# Patient Record
Sex: Male | Born: 1980 | Race: White | Hispanic: No | Marital: Married | Smoking: Never smoker
Health system: Southern US, Community
[De-identification: ages and names within clinical notes are randomized; demographics above are authoritative.]

## PROBLEM LIST (undated history)

## (undated) DIAGNOSIS — B2790 Infectious mononucleosis, unspecified without complication: Secondary | ICD-10-CM

## (undated) DIAGNOSIS — J020 Streptococcal pharyngitis: Secondary | ICD-10-CM

## (undated) HISTORY — PX: WISDOM TOOTH EXTRACTION: SHX21

---

## 2004-08-08 ENCOUNTER — Ambulatory Visit: Payer: Self-pay | Admitting: Family Medicine

## 2005-03-15 ENCOUNTER — Emergency Department: Payer: Self-pay | Admitting: Emergency Medicine

## 2017-12-18 DIAGNOSIS — E782 Mixed hyperlipidemia: Secondary | ICD-10-CM | POA: Insufficient documentation

## 2020-01-30 DIAGNOSIS — U071 COVID-19: Secondary | ICD-10-CM

## 2020-01-30 HISTORY — DX: COVID-19: U07.1

## 2021-07-04 ENCOUNTER — Other Ambulatory Visit: Payer: Self-pay

## 2021-07-04 ENCOUNTER — Emergency Department (HOSPITAL_BASED_OUTPATIENT_CLINIC_OR_DEPARTMENT_OTHER): Payer: 59 | Admitting: Radiology

## 2021-07-04 ENCOUNTER — Emergency Department (HOSPITAL_BASED_OUTPATIENT_CLINIC_OR_DEPARTMENT_OTHER): Payer: 59

## 2021-07-04 ENCOUNTER — Encounter (HOSPITAL_BASED_OUTPATIENT_CLINIC_OR_DEPARTMENT_OTHER): Payer: Self-pay | Admitting: Obstetrics and Gynecology

## 2021-07-04 ENCOUNTER — Emergency Department (HOSPITAL_BASED_OUTPATIENT_CLINIC_OR_DEPARTMENT_OTHER)
Admission: EM | Admit: 2021-07-04 | Discharge: 2021-07-04 | Disposition: A | Discharge status: Home / Self Care | Payer: 59 | Attending: Emergency Medicine | Admitting: Emergency Medicine

## 2021-07-04 DIAGNOSIS — R5383 Other fatigue: Secondary | ICD-10-CM | POA: Diagnosis not present

## 2021-07-04 DIAGNOSIS — R000 Tachycardia, unspecified: Secondary | ICD-10-CM | POA: Insufficient documentation

## 2021-07-04 DIAGNOSIS — R945 Abnormal results of liver function studies: Secondary | ICD-10-CM | POA: Insufficient documentation

## 2021-07-04 DIAGNOSIS — R748 Abnormal levels of other serum enzymes: Secondary | ICD-10-CM

## 2021-07-04 DIAGNOSIS — R110 Nausea: Secondary | ICD-10-CM | POA: Diagnosis not present

## 2021-07-04 DIAGNOSIS — E860 Dehydration: Secondary | ICD-10-CM | POA: Insufficient documentation

## 2021-07-04 DIAGNOSIS — R519 Headache, unspecified: Secondary | ICD-10-CM | POA: Insufficient documentation

## 2021-07-04 DIAGNOSIS — R7401 Elevation of levels of liver transaminase levels: Secondary | ICD-10-CM | POA: Insufficient documentation

## 2021-07-04 HISTORY — DX: Streptococcal pharyngitis: J02.0

## 2021-07-04 HISTORY — DX: Infectious mononucleosis, unspecified without complication: B27.90

## 2021-07-04 LAB — LACTIC ACID, PLASMA: Lactic Acid, Venous: 0.8 mmol/L (ref 0.5–1.9)

## 2021-07-04 LAB — CBC WITH DIFFERENTIAL/PLATELET
Abs Immature Granulocytes: 0.02 10*3/uL (ref 0.00–0.07)
Basophils Absolute: 0.1 10*3/uL (ref 0.0–0.1)
Basophils Relative: 1 %
Eosinophils Absolute: 0 10*3/uL (ref 0.0–0.5)
Eosinophils Relative: 0 %
HCT: 42.8 % (ref 39.0–52.0)
Hemoglobin: 14.9 g/dL (ref 13.0–17.0)
Immature Granulocytes: 0 %
Lymphocytes Relative: 31 %
Lymphs Abs: 2.3 10*3/uL (ref 0.7–4.0)
MCH: 29.7 pg (ref 26.0–34.0)
MCHC: 34.8 g/dL (ref 30.0–36.0)
MCV: 85.4 fL (ref 80.0–100.0)
Monocytes Absolute: 0.4 10*3/uL (ref 0.1–1.0)
Monocytes Relative: 6 %
Neutro Abs: 4.7 10*3/uL (ref 1.7–7.7)
Neutrophils Relative %: 62 %
Platelets: 339 10*3/uL (ref 150–400)
RBC: 5.01 MIL/uL (ref 4.22–5.81)
RDW: 12.5 % (ref 11.5–15.5)
WBC: 7.5 10*3/uL (ref 4.0–10.5)
nRBC: 0 % (ref 0.0–0.2)

## 2021-07-04 LAB — COMPREHENSIVE METABOLIC PANEL
ALT: 140 U/L — ABNORMAL HIGH (ref 0–44)
AST: 61 U/L — ABNORMAL HIGH (ref 15–41)
Albumin: 4.2 g/dL (ref 3.5–5.0)
Alkaline Phosphatase: 261 U/L — ABNORMAL HIGH (ref 38–126)
Anion gap: 11 (ref 5–15)
BUN: 10 mg/dL (ref 6–20)
CO2: 23 mmol/L (ref 22–32)
Calcium: 9.3 mg/dL (ref 8.9–10.3)
Chloride: 99 mmol/L (ref 98–111)
Creatinine, Ser: 0.78 mg/dL (ref 0.61–1.24)
GFR, Estimated: >60 mL/min (ref >60)
Glucose, Bld: 95 mg/dL (ref 70–99)
Potassium: 3.8 mmol/L (ref 3.5–5.1)
Sodium: 133 mmol/L — ABNORMAL LOW (ref 135–145)
Total Bilirubin: 0.6 mg/dL (ref 0.3–1.2)
Total Protein: 8 g/dL (ref 6.5–8.1)

## 2021-07-04 LAB — URINALYSIS, ROUTINE W REFLEX MICROSCOPIC
Bilirubin Urine: NEGATIVE
Glucose, UA: NEGATIVE mg/dL
Hgb urine dipstick: NEGATIVE
Ketones, ur: NEGATIVE mg/dL
Leukocytes,Ua: NEGATIVE
Nitrite: NEGATIVE
Protein, ur: NEGATIVE mg/dL
Specific Gravity, Urine: 1.01 (ref 1.005–1.030)
pH: 5.5 (ref 5.0–8.0)

## 2021-07-04 LAB — CBG MONITORING, ED: Glucose-Capillary: 86 mg/dL (ref 70–99)

## 2021-07-04 MED ORDER — IOHEXOL 300 MG/ML  SOLN
100.0000 mL | Freq: Once | INTRAMUSCULAR | Status: AC | PRN
  Administered 2021-07-04: 100 mL via INTRAVENOUS

## 2021-07-04 MED ORDER — LACTATED RINGERS IV BOLUS
1000.0000 mL | Freq: Once | INTRAVENOUS | Status: AC
  Administered 2021-07-04: 1000 mL via INTRAVENOUS

## 2021-07-04 NOTE — Discharge Instructions (Addendum)
Your work-up today was overall reassuring aside from your elevated liver enzymes.  I know that the urgent care has sent out test for hepatitis which will be good for you to have when you go to your gastroenterology appointment.  If you do not hear from the gastroenterology office in the next 72 hours, please call their office directly.  Continue to drink plenty of water, although I do not think that your symptoms are secondary to dehydration but rather from what is going on with your liver.

## 2021-07-04 NOTE — ED Triage Notes (Signed)
Patient reports he has been sick x2 weeks. Patient endorses a stiff neck x3 days. Headache. Fevers and dehydration.

## 2021-07-04 NOTE — ED Provider Notes (Signed)
[facility] EMERGENCY DEPT Provider Note   CSN: [id number] Arrival date & time: 07/04/21  1148     History  No chief complaint on file.   Ronnie Middleton is a 41 y.o. male presents the ED for evaluation of suspected dehydration.  Patient states that he has been sick for about 2 weeks, although when asked, he states that his symptoms have included fatigue, nausea and headaches.  He denies congestion, fevers, abdominal pain, vomiting and diarrhea.  He believes that he was getting dehydrated as his urine appeared dark to him if he was not drinking copious amounts of water.  He went to urgent care yesterday and had labs drawn and was noted that his liver enzymes were elevated.  He states that this has been chronic for him since he developed mono while living in [place] 4 years ago.  Urgent care reportedly sent out a hepatitis panel, but results are not back yet.  Unable to view these records as they are not connected to epic.  Patient comes in today still concerned about possible dehydration based on the color of his urine.  He is currently not having a headache.  He denies chest pain, shortness of breath, abdominal pain, vomiting, diarrhea.  HPI     Home Medications Prior to Admission medications   Not on File      Allergies    Patient has no known allergies.    Review of Systems   Review of Systems  Physical Exam Updated Vital Signs BP 124/77 (BP Location: Right Arm)   Pulse 97   Temp 98.1 F (36.7 C) (Oral)   Resp 18   Ht 6' 4" (1.93 m)   Wt 97.1 kg   SpO2 99%   BMI 26.05 kg/m  Physical Exam Vitals and nursing note reviewed.  Constitutional:      General: He is not in acute distress.    Appearance: He is not ill-appearing.  HENT:     Head: Atraumatic.  Eyes:     Conjunctiva/sclera: Conjunctivae normal.  Cardiovascular:     Rate and Rhythm: Regular rhythm. Tachycardia present.     Pulses: Normal pulses.     Heart sounds: No murmur heard. Pulmonary:      Effort: Pulmonary effort is normal. No respiratory distress.     Breath sounds: Normal breath sounds.  Abdominal:     General: Abdomen is flat. There is no distension.     Palpations: Abdomen is soft.     Tenderness: There is no abdominal tenderness.  Musculoskeletal:        General: Normal range of motion.     Cervical back: Normal range of motion.  Skin:    General: Skin is warm and dry.     Capillary Refill: Capillary refill takes less than 2 seconds.  Neurological:     General: No focal deficit present.     Mental Status: He is alert.  Psychiatric:        Mood and Affect: Mood normal.    ED Results / Procedures / Treatments   Labs (all labs ordered are listed, but only abnormal results are displayed) Labs Reviewed  COMPREHENSIVE METABOLIC PANEL - Abnormal; Notable for the following components:      Result Value   Sodium 133 (*)    AST 61 (*)    ALT 140 (*)    Alkaline Phosphatase 261 (*)    All other components within normal limits  CULTURE, BLOOD (SINGLE)  LACTIC ACID, PLASMA  CBC WITH DIFFERENTIAL/PLATELET  URINALYSIS, ROUTINE W REFLEX MICROSCOPIC  LACTIC ACID, PLASMA  CBG MONITORING, ED    EKG None  Radiology DG Chest 2 View  Result Date: 07/04/2021 CLINICAL DATA:  Fever EXAM: CHEST - 2 VIEW COMPARISON:  07/05/2009 FINDINGS: The heart size and mediastinal contours are within normal limits. Both lungs are clear. The visualized skeletal structures are unremarkable. IMPRESSION: No active cardiopulmonary disease. Electronically Signed   By: Davina Poke D.O.   On: 07/04/2021 12:27   CT ABDOMEN PELVIS W CONTRAST  Result Date: 07/04/2021 CLINICAL DATA:  Abdominal pain. EXAM: CT ABDOMEN AND PELVIS WITH CONTRAST TECHNIQUE: Multidetector CT imaging of the abdomen and pelvis was performed using the standard protocol following bolus administration of intravenous contrast. RADIATION DOSE REDUCTION: This exam was performed according to the departmental dose-optimization  program which includes automated exposure control, adjustment of the mA and/or kV according to patient size and/or use of iterative reconstruction technique. CONTRAST:  12m OMNIPAQUE IOHEXOL 300 MG/ML  SOLN COMPARISON:  None Available. FINDINGS: Lower chest: No acute abnormality.  Small hiatal hernia. Hepatobiliary: No focal liver abnormality is seen. No gallstones, gallbladder wall thickening, or biliary dilatation. Pancreas: Unremarkable. No pancreatic ductal dilatation or surrounding inflammatory changes. Spleen: Normal in size without focal abnormality. Adrenals/Urinary Tract: Adrenal glands are unremarkable. Kidneys are normal, without renal calculi, focal lesion, or hydronephrosis. Bladder is unremarkable. Stomach/Bowel: Stomach is within normal limits. Appendix appears normal. No evidence of bowel wall thickening, distention, or inflammatory changes. Vascular/Lymphatic: No significant vascular findings are present. No enlarged abdominal or pelvic lymph nodes. Shotty porta hepatic is lymph nodes, sub pathologic by CT criteria. Reproductive: Prostate is unremarkable. Other: No abdominal wall hernia or abnormality. No abdominopelvic ascites. Musculoskeletal: No acute or significant osseous findings. IMPRESSION: 1. No acute abnormalities within the abdomen or pelvis. 2. Small hiatal hernia. Electronically Signed   By: DFidela SalisburyM.D.   On: 07/04/2021 14:27    Procedures Procedures    Medications Ordered in ED Medications  lactated ringers bolus 1,000 mL (0 mLs Intravenous Stopped 07/04/21 1554)  iohexol (OMNIPAQUE) 300 MG/ML solution 100 mL (100 mLs Intravenous Contrast Given 07/04/21 1409)    ED Course/ Medical Decision Making/ A&P                           Medical Decision Making Amount and/or Complexity of Data Reviewed Labs: ordered. Radiology: ordered.  Risk Prescription drug management.   Social determinants of health:  Social History   Socioeconomic History   Marital  status: Married    Spouse name: Not on file   Number of children: Not on file   Years of education: Not on file   Highest education level: Not on file  Occupational History   Not on file  Tobacco Use   Smoking status: Never    Passive exposure: Never   Smokeless tobacco: Never  Vaping Use   Vaping Use: Never used  Substance and Sexual Activity   Alcohol use: Not Currently   Drug use: Not Currently   Sexual activity: Yes  Other Topics Concern   Not on file  Social History Narrative   Not on file   Social Determinants of Health   Financial Resource Strain: Not on file  Food Insecurity: Not on file  Transportation Needs: Not on file  Physical Activity: Not on file  Stress: Not on file  Social Connections: Not on file  Intimate Partner Violence: Not on file  Initial impression:  This patient presents to the ED for concern of dehydration, this involves an extensive number of treatment options, and is a complaint that carries with it a high risk of complications and morbidity.   Differentials include electrolyte abnormality, rhabdomyolysis, viral infection.   Lab Tests  I Ordered, reviewed, and interpreted labs and EKG.  The pertinent results include:  UA with elevated AST and ALT and alk phos CBC, UA, lactic all normal.  Imaging Studies ordered:  I ordered imaging studies including  CT abdomen without acute findings Chest x-ray without acute findings I independently visualized and interpreted imaging and I agree with the radiologist interpretation.   Cardiac Monitoring:  The patient was maintained on a cardiac monitor.  I personally viewed and interpreted the cardiac monitored which showed an underlying rhythm of: Sinus tachycardia into sinus rhythm   Medicines ordered and prescription drug management:  I ordered medication including: 1 L fluid bolus Reevaluation of the patient after these medicines showed that the patient improved I have reviewed the  patients home medicines and have made adjustments as needed    ED Course/Re-evaluation: 41 year old male presents to the ED with concern for general fatigue and possible dehydration.  Triage noted heart rate to be 130, however during my evaluation he was hovering between 105 and 110.  Overall he is well-appearing, nontoxic.  Physical exam was overall benign.  Negative meningeal signs.  Abdomen was soft, nondistended and nontender to all palpation in all quadrants.  I gave him 1 L of IV fluids which resolved his tachycardia.  His labs are overall benign aside from elevations in AST, ALT and alk phos.  Patient states that this has been ongoing since he was diagnosed with melena in [place] 4 years ago.  He has hepatitis panel pending through urgent care.  Given these findings, I did obtain CT abdomen which was negative for any acute findings.  Chest x-ray was also normal.  I do not think that patient's symptoms are secondary to dehydration, and rather the color changes in his urine that he is seeing is likely from his elevated liver enzymes.  I do believe that he needs to see a gastroenterologist for further evaluation.  I discussed this with patient who is understanding and amenable to plan.  Ambulatory referral to gastroenterology was placed.  Disposition:  After consideration of the diagnostic results, physical exam, history and the patients response to treatment feel that the patent would benefit from discharge.   Elevated liver enzymes: Plan and management as described above. Discharged home in good condition.  Final Clinical Impression(s) / ED Diagnoses Final diagnoses:  Elevated liver enzymes    Rx / DC Orders ED Discharge Orders          Ordered    Ambulatory referral to Gastroenterology       Comments: Elevated liver enzymes   07/04/21 1545              Tonye Pearson, PA-C 07/04/21 1654    Isla Pence, MD 07/05/21 [phone]

## 2021-07-06 DIAGNOSIS — R7989 Other specified abnormal findings of blood chemistry: Secondary | ICD-10-CM

## 2021-07-09 LAB — CULTURE, BLOOD (SINGLE)
Culture: NO GROWTH
Special Requests: ADEQUATE

## 2021-07-17 ENCOUNTER — Emergency Department (HOSPITAL_BASED_OUTPATIENT_CLINIC_OR_DEPARTMENT_OTHER): Payer: 59

## 2021-07-17 ENCOUNTER — Emergency Department (HOSPITAL_COMMUNITY): Payer: 59

## 2021-07-17 ENCOUNTER — Encounter: Payer: Self-pay | Admitting: Gastroenterology

## 2021-07-17 ENCOUNTER — Inpatient Hospital Stay (HOSPITAL_BASED_OUTPATIENT_CLINIC_OR_DEPARTMENT_OTHER)
Admission: EM | Admit: 2021-07-17 | Discharge: 2021-07-20 | DRG: 175 | Disposition: A | Discharge status: Home / Self Care | Payer: 59 | Attending: Student | Admitting: Student

## 2021-07-17 ENCOUNTER — Other Ambulatory Visit: Payer: Self-pay

## 2021-07-17 ENCOUNTER — Emergency Department (HOSPITAL_BASED_OUTPATIENT_CLINIC_OR_DEPARTMENT_OTHER): Payer: 59 | Admitting: Radiology

## 2021-07-17 ENCOUNTER — Encounter (HOSPITAL_BASED_OUTPATIENT_CLINIC_OR_DEPARTMENT_OTHER): Payer: Self-pay

## 2021-07-17 DIAGNOSIS — Z8042 Family history of malignant neoplasm of prostate: Secondary | ICD-10-CM | POA: Diagnosis not present

## 2021-07-17 DIAGNOSIS — R079 Chest pain, unspecified: Principal | ICD-10-CM

## 2021-07-17 DIAGNOSIS — R7989 Other specified abnormal findings of blood chemistry: Secondary | ICD-10-CM

## 2021-07-17 DIAGNOSIS — Z808 Family history of malignant neoplasm of other organs or systems: Secondary | ICD-10-CM

## 2021-07-17 DIAGNOSIS — I2699 Other pulmonary embolism without acute cor pulmonale: Secondary | ICD-10-CM | POA: Diagnosis present

## 2021-07-17 DIAGNOSIS — Z8616 Personal history of COVID-19: Secondary | ICD-10-CM

## 2021-07-17 DIAGNOSIS — I2694 Multiple subsegmental pulmonary emboli without acute cor pulmonale: Secondary | ICD-10-CM | POA: Diagnosis not present

## 2021-07-17 DIAGNOSIS — R5383 Other fatigue: Secondary | ICD-10-CM | POA: Diagnosis not present

## 2021-07-17 DIAGNOSIS — B2700 Gammaherpesviral mononucleosis without complication: Secondary | ICD-10-CM | POA: Diagnosis not present

## 2021-07-17 DIAGNOSIS — B2790 Infectious mononucleosis, unspecified without complication: Secondary | ICD-10-CM

## 2021-07-17 DIAGNOSIS — J189 Pneumonia, unspecified organism: Secondary | ICD-10-CM

## 2021-07-17 DIAGNOSIS — I2693 Single subsegmental pulmonary embolism without acute cor pulmonale: Secondary | ICD-10-CM | POA: Diagnosis not present

## 2021-07-17 DIAGNOSIS — Z8249 Family history of ischemic heart disease and other diseases of the circulatory system: Secondary | ICD-10-CM | POA: Diagnosis not present

## 2021-07-17 DIAGNOSIS — M549 Dorsalgia, unspecified: Secondary | ICD-10-CM

## 2021-07-17 LAB — CBC WITH DIFFERENTIAL/PLATELET
Abs Immature Granulocytes: 0.03 10*3/uL (ref 0.00–0.07)
Basophils Absolute: 0.1 10*3/uL (ref 0.0–0.1)
Basophils Relative: 0 %
Eosinophils Absolute: 0 10*3/uL (ref 0.0–0.5)
Eosinophils Relative: 0 %
HCT: 37.7 % — ABNORMAL LOW (ref 39.0–52.0)
Hemoglobin: 12.6 g/dL — ABNORMAL LOW (ref 13.0–17.0)
Immature Granulocytes: 0 %
Lymphocytes Relative: 29 %
Lymphs Abs: 3.4 10*3/uL (ref 0.7–4.0)
MCH: 29.1 pg (ref 26.0–34.0)
MCHC: 33.4 g/dL (ref 30.0–36.0)
MCV: 87.1 fL (ref 80.0–100.0)
Monocytes Absolute: 1 10*3/uL (ref 0.1–1.0)
Monocytes Relative: 9 %
Neutro Abs: 7.2 10*3/uL (ref 1.7–7.7)
Neutrophils Relative %: 62 %
Platelets: 342 10*3/uL (ref 150–400)
RBC: 4.33 MIL/uL (ref 4.22–5.81)
RDW: 12.7 % (ref 11.5–15.5)
WBC: 11.8 10*3/uL — ABNORMAL HIGH (ref 4.0–10.5)
nRBC: 0 % (ref 0.0–0.2)

## 2021-07-17 LAB — HEPATIC FUNCTION PANEL
ALT: 56 U/L — ABNORMAL HIGH (ref 0–44)
AST: 28 U/L (ref 15–41)
Albumin: 3.9 g/dL (ref 3.5–5.0)
Alkaline Phosphatase: 169 U/L — ABNORMAL HIGH (ref 38–126)
Bilirubin, Direct: 0.2 mg/dL (ref 0.0–0.2)
Indirect Bilirubin: 0.5 mg/dL (ref 0.3–0.9)
Total Bilirubin: 0.7 mg/dL (ref 0.3–1.2)
Total Protein: 7.4 g/dL (ref 6.5–8.1)

## 2021-07-17 LAB — BASIC METABOLIC PANEL
Anion gap: 9 (ref 5–15)
BUN: 9 mg/dL (ref 6–20)
CO2: 28 mmol/L (ref 22–32)
Calcium: 9.3 mg/dL (ref 8.9–10.3)
Chloride: 100 mmol/L (ref 98–111)
Creatinine, Ser: 0.75 mg/dL (ref 0.61–1.24)
GFR, Estimated: >60 mL/min (ref >60)
Glucose, Bld: 96 mg/dL (ref 70–99)
Potassium: 3.7 mmol/L (ref 3.5–5.1)
Sodium: 137 mmol/L (ref 135–145)

## 2021-07-17 LAB — SARS CORONAVIRUS 2 BY RT PCR: SARS Coronavirus 2 by RT PCR: NEGATIVE

## 2021-07-17 LAB — PROTIME-INR
INR: 1 (ref 0.8–1.2)
Prothrombin Time: 13.4 seconds (ref 11.4–15.2)

## 2021-07-17 LAB — TROPONIN I (HIGH SENSITIVITY)
Troponin I (High Sensitivity): 2 ng/L (ref <18)
Troponin I (High Sensitivity): 3 ng/L (ref <18)

## 2021-07-17 LAB — D-DIMER, QUANTITATIVE: D-Dimer, Quant: 3.66 ug/mL-FEU — ABNORMAL HIGH (ref 0.00–0.50)

## 2021-07-17 MED ORDER — PANTOPRAZOLE SODIUM 40 MG PO TBEC
40.0000 mg | DELAYED_RELEASE_TABLET | Freq: Every day | ORAL | Status: DC
  Administered 2021-07-18 – 2021-07-20 (×3): 40 mg via ORAL
  Filled 2021-07-17 (×3): qty 1

## 2021-07-17 MED ORDER — SODIUM CHLORIDE 0.9 % IV SOLN: 500.0000 mg | INTRAVENOUS | Status: DC

## 2021-07-17 MED ORDER — HEPARIN (PORCINE) 25000 UT/250ML-% IV SOLN
1750.0000 [IU]/h | INTRAVENOUS | Status: DC
  Administered 2021-07-17 – 2021-07-19 (×3): 1750 [IU]/h via INTRAVENOUS
  Filled 2021-07-17 (×3): qty 250

## 2021-07-17 MED ORDER — SODIUM CHLORIDE 0.9 % IV SOLN: Freq: Once | INTRAVENOUS | Status: AC

## 2021-07-17 MED ORDER — OMEPRAZOLE MAGNESIUM 20 MG PO TBEC: 20.0000 mg | DELAYED_RELEASE_TABLET | Freq: Every day | ORAL | Status: DC

## 2021-07-17 MED ORDER — ACETAMINOPHEN 325 MG PO TABS
650.0000 mg | ORAL_TABLET | Freq: Four times a day (QID) | ORAL | Status: DC | PRN
  Filled 2021-07-17: qty 2

## 2021-07-17 MED ORDER — MORPHINE SULFATE (PF) 4 MG/ML IV SOLN
4.0000 mg | Freq: Once | INTRAVENOUS | Status: AC
  Administered 2021-07-17: 4 mg via INTRAVENOUS
  Filled 2021-07-17: qty 1

## 2021-07-17 MED ORDER — SODIUM CHLORIDE 0.9 % IV SOLN
1.0000 g | INTRAVENOUS | Status: DC
  Administered 2021-07-17: 1 g via INTRAVENOUS
  Filled 2021-07-17: qty 10

## 2021-07-17 MED ORDER — SODIUM CHLORIDE 0.9 % IV SOLN
500.0000 mg | Freq: Once | INTRAVENOUS | Status: AC
  Administered 2021-07-17: 500 mg via INTRAVENOUS
  Filled 2021-07-17: qty 5

## 2021-07-17 MED ORDER — HEPARIN BOLUS VIA INFUSION
4500.0000 [IU] | Freq: Once | INTRAVENOUS | Status: AC
  Administered 2021-07-17: 4500 [IU] via INTRAVENOUS
  Filled 2021-07-17: qty 4500

## 2021-07-17 MED ORDER — IOHEXOL 350 MG/ML SOLN
80.0000 mL | Freq: Once | INTRAVENOUS | Status: AC | PRN
Start: 2021-07-17 — End: 2021-07-17
  Administered 2021-07-17: 80 mL via INTRAVENOUS

## 2021-07-17 NOTE — ED Triage Notes (Signed)
Patient here POV from Home.  States he has been feeling Ill for approximately 3-4 Weeks. Seen in ED for Same approximately 10 Days PTA and had Multiple Lab Specimens and Imaging completed and was instructed to Follow Up with PCP.   Endorses Back Pain as well and continuing Symptoms such as Fatigue.   Noted Blood in Cough today this AM. No Fevers.  NAD Noted during Triage. A&Ox4. GCS 15. Ambulatory.

## 2021-07-17 NOTE — Progress Notes (Signed)
[facility] for IV heparin Indication: new PE  No Known Allergies  Patient Measurements: Height: '6\' 4"'$  (193 cm) Weight: 97.1 kg (214 lb 1.1 oz) IBW/kg (Calculated) : 86.8 Heparin Dosing Weight: TBW  Vital Signs: Temp: 100.7 F (38.2 C) (06/19 1825) Temp Source: Oral (06/19 1825) BP: 136/81 (06/19 2100) Pulse Rate: 115 (06/19 2100)  Labs: Recent Labs    07/17/21 1540  HGB 12.6*  HCT 37.7*  PLT 342  LABPROT 13.4  INR 1.0  CREATININE 0.75  TROPONINIHS 2    Estimated Creatinine Clearance: 153.7 mL/min (by C-G formula based on SCr of 0.75 mg/dL).   Medical History: Past Medical History:  Diagnosis Date   COVID 2022   Mononucleosis    Strep throat     Medications:  (Not in a hospital admission)  Scheduled:   heparin  4,500 Units Intravenous Once   PRN:   Assessment: 24 yoM presenting with 3-4 weeks of malaise, R-sided CP with hemoptysis x 5 days, tachycardia, fever, and elevated D-dimer. CTA shows multiple RLL segmental/subsegmental emboli accompanied by possible PNA or infarct; no RH strain noted. Pharmacy to dose IV heparin.  Baseline INR, aPTT: not done Prior anticoagulation: none  Significant events:  Today, 07/17/2021: CBC: Hgb slightly low; Plt WNL SCr WNL, baseline No bleeding or infusion issues per nursing  Goal of Therapy: Heparin level 0.3-0.7 units/ml Monitor platelets by anticoagulation protocol: Yes  Plan: Heparin 4500 units IV bolus x 1 Heparin 1750 units/hr IV infusion Check heparin level 6 hrs after start Daily CBC, daily heparin level once stable Monitor for signs of bleeding or thrombosis  Reuel Boom, PharmD, BCPS [phone] 07/17/2021, 9:10 PM

## 2021-07-17 NOTE — Assessment & Plan Note (Signed)
-  has been chronically elevated since monoinfection in 2019.  Denies alcohol use.  Today AST of 56, AST is normal and an alkaline phosphatase of 169.  This is actually improve from lab work 2 weeks ago. -RUQ ultrasounds negative -Obtain acute hepatitis panel, obtaining EBV IgM antibody as above

## 2021-07-17 NOTE — ED Notes (Signed)
Pt to [facility] transfer form [facility] via [facility].  Pt reports coughing up blood and d-dimer 3.66. Pt transferred for CTA.  Last :VS120/70, HR 110,98%RA

## 2021-07-17 NOTE — ED Notes (Signed)
Handoff report given to [facility] 

## 2021-07-17 NOTE — Assessment & Plan Note (Addendum)
-   Multiple emboli seen with largest to RLL.  No heart strain with reassuring negative troponin.  At this point, appears to be unprovoked. -Obtain bilateral venous Doppler ultrasound -checking COVID PCR given his respiratory symptoms, which could explain why he might be prothrombotic -Will check coagulopathy labs -continue IV heparin infusion overnight

## 2021-07-17 NOTE — ED Notes (Signed)
Handoff report given to Media planner at [facility]

## 2021-07-17 NOTE — H&P (Signed)
History and Physical    Patient: Ronnie Middleton IRC:[id number] DOB: 1980-07-01 DOA: 07/17/2021 DOS: the patient was seen and examined on 07/17/2021 PCP: Algis Greenhouse, MD  Patient coming from: Home  Chief Complaint:  Chief Complaint  Patient presents with   Hemoptysis   HPI: Ronnie Middleton is a 41 y.o. 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   About a month ago he began to have increasing fatigue and initially had about 3 days of neck stiffness that resolved.  Then 2 weeks ago started to have low-grade fevers up to 100.7.  For the past 10 days has developed a worsening cough.  40-monthold child at home has also been having runny nose.  He then developed intermittent severe right shoulder and neck pain in the past 5 days and has been unable to sleep.  He became more concerned today when he noted hemoptysis.   Has been seen by primary and at urgent care. Has blood work but not able to see results in epic.  Notes that he had a negative monotest.  Negative hepatitis B and C test.  Currently has a referral to see [facility] for chronically elevated LFT.  [facility].  Had negative CT abdomen pelvis.  LFTs at that time were elevated but CBC, UA and chest x-ray were unremarkable.  [facility] for CT chest due to elevated D-dimer up to 3.66.  CTA chest was positive for pulmonary emboli most prominent in the right lower lobe.  No evidence of right heart strain.  There is airspace density to the right lower lobe that could represent atelectasis versus infiltrate.  There is also small right pleural effusion.  He denies any recent surgeries.  No recent travel.  No tobacco or illicit drug use.  No personal history of DVT/PE.  No family history of coagulopathy.  He works at a [employer]  Review of Systems: As mentioned in the history of present illness. All other systems reviewed and are negative. Past Medical History:  Diagnosis Date   COVID 2022   Mononucleosis    Strep throat    History reviewed. No pertinent surgical history. Social History:  reports that he has never smoked. He has never been exposed to tobacco smoke. He has never used smokeless tobacco. He reports that he does not currently use alcohol. He reports that he does not currently use drugs.  No Known Allergies  Family history Father-hypertension Maternal grandfather-prostate cancer Maternal grandmother-melanoma  Prior to Admission medications   Medication Sig Start Date End Date Taking? Authorizing Provider  ibuprofen (ADVIL) 600 MG tablet Take 600 mg by mouth every 6 (six) hours as needed for mild pain.   Yes [provider]  omeprazole (PRILOSEC OTC) 20 MG tablet Take 1 tablet by mouth daily.   Yes [provider]    Physical Exam: Vitals:   07/17/21 2015 07/17/21 2045 07/17/21 2100 07/17/21 2130  BP: 127/77 136/83 136/81 (!) 144/83  Pulse: (!) 116 (!) 113 (!) 115 (!) 123  Resp: 20 20 (!) 21 19  Temp:      TempSrc:      SpO2: 97% 96% 99% 96%  Weight:      Height:       Constitutional: NAD, calm, comfortable, nontoxic appearing young male sitting  upright in bed Eyes:  lids and conjunctivae normal ENMT: Mucous membranes are moist.  Neck: normal, supple Respiratory: clear to auscultation bilaterally, no wheezing, no crackles. Normal respiratory effort. No accessory muscle use.  Cardiovascular: Regular rate and rhythm, no murmurs / rubs / gallops.  Mild none pitting edema of bilateral ankles Abdomen: no tenderness. Bowel sounds positive.  Musculoskeletal: no clubbing / cyanosis. No joint deformity upper and lower extremities. Good ROM, no contractures. Normal muscle tone.  Skin: no rashes, lesions, ulcers.  Neurologic: CN 2-12 grossly intact. Strength 5/5 in all  4.  Psychiatric: Normal judgment and insight. Alert and oriented x 3. Normal mood. Data Reviewed:  See HPI  Assessment and Plan: * Pulmonary embolism ([place]) - Multiple emboli seen with largest to RLL.  No heart strain with reassuring negative troponin.  At this point, appears to be unprovoked. -Obtain bilateral venous Doppler ultrasound -checking COVID PCR given his respiratory symptoms, which could explain why he might be prothrombotic -Will check coagulopathy labs -continue IV heparin infusion overnight  Abnormal LFTs -has been chronically elevated since monoinfection in 2019.  Denies alcohol use.  Today AST of 56, AST is normal and an alkaline phosphatase of 169.  This is actually improve from lab work 2 weeks ago. -RUQ ultrasounds negative -Obtain acute hepatitis panel, obtaining EBV IgM antibody as above  Fatigue Patient with nonspecific symptoms of fatigue, intermittent fever with new finding of pulmonary embolism.  He has history of mono with chronic elevated LFTs.  Reportedly had negative mono tests outpatient. -will test for EBV IgM antibody, HIV -TSH -check procalcitonin, COVID PCR and RVP since his symptoms with finding of RLL opacity not convincing for bacteria pneumonia.  Continue IV antibiotics in the meantime.      Advance Care Planning:   Code Status: Full Code   Consults: None  Family Communication: Discussed with father bedside  Severity of Illness: The appropriate patient status for this patient is OBSERVATION. Observation status is judged to be reasonable and necessary in order to provide the required intensity of service to ensure the patient's safety. The patient's presenting symptoms, physical exam findings, and initial radiographic and laboratory data in the context of their medical condition is felt to place them at decreased risk for further clinical deterioration. Furthermore, it is anticipated that the patient will be medically stable for discharge from  the hospital within 2 midnights of admission.   Author: Orene Desanctis, DO 07/17/2021 11:56 PM  For on call review [email or web address].

## 2021-07-17 NOTE — ED Notes (Signed)
Patient transported to CT 

## 2021-07-17 NOTE — Assessment & Plan Note (Addendum)
Patient with nonspecific symptoms of fatigue, intermittent fever with new finding of pulmonary embolism.  He has history of mono with chronic elevated LFTs.  Reportedly had negative mono tests outpatient. -will test for EBV IgM antibody, HIV -TSH -check procalcitonin, COVID PCR and RVP since his symptoms with finding of RLL opacity not convincing for bacteria pneumonia.  Continue IV antibiotics in the meantime.

## 2021-07-17 NOTE — ED Provider Notes (Addendum)
[facility] EMERGENCY DEPT Provider Note   CSN: [id number] Arrival date & time: 07/17/21  1224     History  Chief Complaint  Patient presents with   Hemoptysis    Ronnie Middleton is a 41 y.o. male who presents to the emergency department for evaluation of right-sided chest pain.  Patient states that he has been feeling ill for approximately 3 to 4 weeks.  Patient was seen here 10 days ago and had extensive work-up including CT abdomen and labs.  At the time, his LFTs were elevated, and patient noted that this has been fairly normal for him since he was diagnosed with mono in [place] 4 years ago.  CT abdomen done the same day was without acute findings.  Patient states that in urgent care had sent out a hepatitis panel however he has not heard back from it.  He has an appointment with his PCP this coming Friday who plans to repeat hepatitis panel and lab work.  Since then, patient has had continued fatigue, but he has now developed right-sided chest pain that seem to initially start higher up towards his neck and is now in right lower chest area.  Pain is worse with inspiration.  He is also developed a dry cough and this morning noted blood was in the cough.  He denies fevers at home.  He denies abdominal pain, nausea, vomiting and diarrhea.  No recent travel, trauma.  Patient is a non-smoker.  HPI     Home Medications Prior to Admission medications   Not on File      Allergies    Patient has no known allergies.    Review of Systems   Review of Systems  Physical Exam Updated Vital Signs BP 115/72   Pulse (!) 105   Temp 98.6 F (37 C) (Oral)   Resp (!) 25   Ht '6\' 4"'$  (1.93 m)   Wt 97.1 kg   SpO2 94%   BMI 26.06 kg/m  Physical Exam Vitals and nursing note reviewed.  Constitutional:      General: He is not in acute distress.    Appearance: He is not ill-appearing.  HENT:     Head: Atraumatic.  Eyes:     Conjunctiva/sclera: Conjunctivae normal.   Cardiovascular:     Rate and Rhythm: Regular rhythm. Tachycardia present.     Pulses: Normal pulses.     Heart sounds: No murmur heard. Pulmonary:     Effort: Pulmonary effort is normal. No respiratory distress.     Breath sounds: Normal breath sounds.     Comments: Lungs CTA bilaterally Abdominal:     General: Abdomen is flat. There is no distension.     Palpations: Abdomen is soft.     Tenderness: There is no abdominal tenderness.  Musculoskeletal:        General: Normal range of motion.     Cervical back: Normal range of motion.  Skin:    General: Skin is warm and dry.     Capillary Refill: Capillary refill takes less than 2 seconds.  Neurological:     General: No focal deficit present.     Mental Status: He is alert.  Psychiatric:        Mood and Affect: Mood normal.     ED Results / Procedures / Treatments   Labs (all labs ordered are listed, but only abnormal results are displayed) Labs Reviewed  CBC WITH DIFFERENTIAL/PLATELET - Abnormal; Notable for the following components:  Result Value   WBC 11.8 (*)    Hemoglobin 12.6 (*)    HCT 37.7 (*)    All other components within normal limits  D-DIMER, QUANTITATIVE - Abnormal; Notable for the following components:   D-Dimer, Quant 3.66 (*)    All other components within normal limits  HEPATIC FUNCTION PANEL - Abnormal; Notable for the following components:   ALT 56 (*)    Alkaline Phosphatase 169 (*)    All other components within normal limits  BASIC METABOLIC PANEL  PROTIME-INR  TROPONIN I (HIGH SENSITIVITY)    EKG None  Radiology US Abdomen Limited RUQ (LIVER/GB)  Result Date: 07/17/2021 CLINICAL DATA:  Elevated LFTs. EXAM: ULTRASOUND ABDOMEN LIMITED RIGHT UPPER QUADRANT COMPARISON:  CT of the abdomen and pelvis 07/04/2021 FINDINGS: Gallbladder: The patient admits to eating 2-3 hours prior to the exam. The gallbladder is somewhat contracted. Wall thickness is within normal limits. No sonographic Percell Miller  sign is present. No stones. Common bile duct: Diameter: 0.6 mm, within normal limits Liver: No focal lesion identified. Within normal limits in parenchymal echogenicity. Portal vein is patent on color Doppler imaging with normal direction of blood flow towards the liver. Other: None. IMPRESSION: Negative right upper quadrant ultrasound. Electronically Signed   By: San Morelle M.D.   On: 07/17/2021 16:54   DG Chest 2 View  Result Date: 07/17/2021 CLINICAL DATA:  Hemoptysis. EXAM: CHEST - 2 VIEW COMPARISON:  Chest radiograph dated July 04, 2021 FINDINGS: The heart size and mediastinal contours are within normal limits. Bibasilar atelectasis. No focal consolidation or large pleural effusion. The visualized skeletal structures are unremarkable. IMPRESSION: Bibasilar atelectasis, no evidence of pneumonia or large pleural effusion. Electronically Signed   By: Keane Police D.O.   On: 07/17/2021 13:17    Procedures Procedures    Medications Ordered in ED Medications  morphine (PF) 4 MG/ML injection 4 mg (4 mg Intravenous Given 07/17/21 1702)    ED Course/ Medical Decision Making/ A&P                           Medical Decision Making Amount and/or Complexity of Data Reviewed Labs: ordered. Radiology: ordered.  Risk Prescription drug management.   Social determinants of health:  Social History   Socioeconomic History   Marital status: Married    Spouse name: Not on file   Number of children: Not on file   Years of education: Not on file   Highest education level: Not on file  Occupational History   Not on file  Tobacco Use   Smoking status: Never    Passive exposure: Never   Smokeless tobacco: Never  Vaping Use   Vaping Use: Never used  Substance and Sexual Activity   Alcohol use: Not Currently   Drug use: Not Currently   Sexual activity: Yes  Other Topics Concern   Not on file  Social History Narrative   Not on file   Social Determinants of Health   Financial  Resource Strain: Not on file  Food Insecurity: Not on file  Transportation Needs: Not on file  Physical Activity: Not on file  Stress: Not on file  Social Connections: Not on file  Intimate Partner Violence: Not on file     Initial impression:  This patient presents to the ED for concern of right-sided chest pain with dry cough and hemoptysis, this involves an extensive number of treatment options, and is a complaint that carries with it  a high risk of complications and morbidity.   Differentials include PE, ACS, pneumonia, bronchitis.   Comorbidities affecting care:  None  Additional history obtained: Wife, recent labs and CT imaging from 07/04/2021  Lab Tests  I Ordered, reviewed, and interpreted labs and EKG.  The pertinent results include:  Leukocytosis 11.8 BMP unremarkable INR normal D-dimer elevated at 3.66 Troponin normal LFTs elevated, similar to 10 days ago  Imaging Studies ordered:  I ordered imaging studies including  Chest x-ray without acute findings Right upper quadrant ultrasound normal I independently visualized and interpreted imaging and I agree with the radiologist interpretation.   EKG: Sinus tachycardia  Cardiac Monitoring:  The patient was maintained on a cardiac monitor.  I personally viewed and interpreted the cardiac monitored which showed an underlying rhythm of: Sinus tachycardia   Medicines ordered and prescription drug management:  I ordered medication including: Morphine 4 mg Reevaluation of the patient after these medicines showed that the patient improved I have reviewed the patients home medicines and have made adjustments as needed    ED Course/Re-evaluation: 41 year old male presents to the ED for evaluation of right-sided chest pain, hemoptysis and ongoing fatigue.At triage, patient was noted to be febrile to 100.6 which resolved spontaneously without medical intervention.  He has been persistently tachycardic ranging between  105 and 120 bpm.  Overall, his physical exam was benign and lungs were CTA bilaterally.  Chest x-ray shows atelectasis without focal consolidation.  Given patient's overall presentation, I do have concern that he has a PE.  Since we are here at droppage without CT scanner, a D-dimer was obtained first which was quite elevated at 3.66.  Plan to transfer to [facility] where Dr. Lavenia Atlas has agreed to accept.  Given patient's tests recently, I also obtained INR and repeat hepatic function panel.  INR was normal.  I also obtained right upper quadrant ultrasound Disposition:  After consideration of the diagnostic results, physical exam, history and the patients response to treatment feel that the patent would benefit from transfer to [facility] for CTA.   Chest pain: Plan and management as described above. Discharged home in good condition.  Final Clinical Impression(s) / ED Diagnoses Final diagnoses:  Chest pain, unspecified type    Rx / DC Orders ED Discharge Orders     None         Tonye Pearson, PA-C 07/17/21 1722    Tonye Pearson, [place] 07/17/21 1722    Fredia Sorrow, MD 07/29/21 2317

## 2021-07-17 NOTE — ED Notes (Signed)
[facility] at bedside 

## 2021-07-17 NOTE — ED Provider Notes (Signed)
[facility] DEPT Provider Note   CSN: [id number] Arrival date & time: 07/17/21  1224     History  Chief Complaint  Patient presents with   Hemoptysis    Ronnie Middleton is a 41 y.o. male.  HPI  41 year old male presents emergency department as a transfer from our drop bridge facility for CT to rule out PE.  Patient presented to our outside facility complaining of approximately 3 to 4 weeks of generalized illness.  He was noted to be tachycardic, had an elevated D-dimer and was referred here for CT.  He states he has been evaluated through urgent care, primary doctor and another emergency room with no etiology of his ongoing illness.  Patient's main complaint is 5 days of worsening right-sided chest pain, specifically on inspiration with cough.  He endorses blood-tinged mucus that started this morning and also fever/chills.  He has no abdominal pain, nausea/vomiting/diarrhea or genitourinary symptoms.  Of note patient mentions that he was "very sick" with mono in [place] 4 years ago and since then his liver function has not been normal.  Home Medications Prior to Admission medications   Not on File      Allergies    Patient has no known allergies.    Review of Systems   Review of Systems  Constitutional:  Positive for chills, fatigue and fever.  Respiratory:  Positive for cough and shortness of breath.   Cardiovascular:  Positive for chest pain. Negative for palpitations and leg swelling.  Gastrointestinal:  Negative for abdominal pain, diarrhea and vomiting.  Skin:  Negative for rash.  Neurological:  Negative for headaches.    Physical Exam Updated Vital Signs BP 127/77   Pulse (!) 116   Temp (!) 100.7 F (38.2 C) (Oral)   Resp 20   Ht '6\' 4"'$  (1.93 m)   Wt 97.1 kg   SpO2 97%   BMI 26.06 kg/m  Physical Exam Vitals and nursing note reviewed.  Constitutional:      General: He is not in acute distress.    Appearance: Normal appearance.   HENT:     Head: Normocephalic.     Mouth/Throat:     Mouth: Mucous membranes are moist.  Cardiovascular:     Rate and Rhythm: Tachycardia present.  Pulmonary:     Effort: Pulmonary effort is normal. No respiratory distress.     Breath sounds: No rales.  Abdominal:     Palpations: Abdomen is soft.     Tenderness: There is no abdominal tenderness.  Musculoskeletal:        General: No swelling.     Cervical back: No rigidity.  Skin:    General: Skin is warm.  Neurological:     Mental Status: He is alert and oriented to person, place, and time. Mental status is at baseline.  Psychiatric:        Mood and Affect: Mood normal.     ED Results / Procedures / Treatments   Labs (all labs ordered are listed, but only abnormal results are displayed) Labs Reviewed  CBC WITH DIFFERENTIAL/PLATELET - Abnormal; Notable for the following components:      Result Value   WBC 11.8 (*)    Hemoglobin 12.6 (*)    HCT 37.7 (*)    All other components within normal limits  D-DIMER, QUANTITATIVE - Abnormal; Notable for the following components:   D-Dimer, Quant 3.66 (*)    All other components within normal limits  HEPATIC FUNCTION PANEL -  Abnormal; Notable for the following components:   ALT 56 (*)    Alkaline Phosphatase 169 (*)    All other components within normal limits  BASIC METABOLIC PANEL  PROTIME-INR  TROPONIN I (HIGH SENSITIVITY)  TROPONIN I (HIGH SENSITIVITY)    EKG None  Radiology CT Angio Chest PE W/Cm &/Or Wo Cm  Result Date: 07/17/2021 CLINICAL DATA:  Suspected pulmonary embolism, positive D-dimer EXAM: CT ANGIOGRAPHY CHEST WITH CONTRAST TECHNIQUE: Multidetector CT imaging of the chest was performed using the standard protocol during bolus administration of intravenous contrast. Multiplanar CT image reconstructions and MIPs were obtained to evaluate the vascular anatomy. RADIATION DOSE REDUCTION: This exam was performed according to the departmental dose-optimization  program which includes automated exposure control, adjustment of the mA and/or kV according to patient size and/or use of iterative reconstruction technique. CONTRAST:  37m OMNIPAQUE IOHEXOL 350 MG/ML SOLN COMPARISON:  Chest x-ray earlier the same day FINDINGS: Cardiovascular: Multiple pulmonary artery filling defects identified consistent with emboli, largest in the right lower lobe proximal segmental to subsegmental branches. Additional small emboli are mostly subsegmental. No saddle embolism identified. Main pulmonary artery is normal caliber. Heart size is normal. No pericardial effusion. No evidence of right heart strain. Thoracic aorta is normal in course and caliber. Mediastinum/Nodes: No bulky axillary, hilar or mediastinal lymphadenopathy identified. Lungs/Pleura: Irregular ground-glass airspace densities the basilar and dependent aspect of the right lower lobe with adjacent subsegmental atelectatic changes. Small right pleural effusion. No pneumothorax. Upper Abdomen: Tiny hiatal hernia.  No acute process identified. Musculoskeletal: No chest wall abnormality. No acute or significant osseous findings. Review of the MIP images confirms the above findings. IMPRESSION: 1. Positive for pulmonary emboli, most prominent in the right lower lobe as described. No saddle embolism or evidence of right heart strain. 2. Airspace densities at the base of the right lower lobe which may represent atelectasis and infiltrate. 3. Small right pleural effusion. Findings discussed with Dr. HDina Richover the telephone at 7:31 p.m. on 07/17/2021 with read back. Electronically Signed   By: DOfilia NeasM.D.   On: 07/17/2021 19:32   UKoreaAbdomen Limited RUQ (LIVER/GB)  Result Date: 07/17/2021 CLINICAL DATA:  Elevated LFTs. EXAM: ULTRASOUND ABDOMEN LIMITED RIGHT UPPER QUADRANT COMPARISON:  CT of the abdomen and pelvis 07/04/2021 FINDINGS: Gallbladder: The patient admits to eating 2-3 hours prior to the exam. The gallbladder is  somewhat contracted. Wall thickness is within normal limits. No sonographic MPercell Millersign is present. No stones. Common bile duct: Diameter: 0.6 mm, within normal limits Liver: No focal lesion identified. Within normal limits in parenchymal echogenicity. Portal vein is patent on color Doppler imaging with normal direction of blood flow towards the liver. Other: None. IMPRESSION: Negative right upper quadrant ultrasound. Electronically Signed   By: CSan MorelleM.D.   On: 07/17/2021 16:54   DG Chest 2 View  Result Date: 07/17/2021 CLINICAL DATA:  Hemoptysis. EXAM: CHEST - 2 VIEW COMPARISON:  Chest radiograph dated July 04, 2021 FINDINGS: The heart size and mediastinal contours are within normal limits. Bibasilar atelectasis. No focal consolidation or large pleural effusion. The visualized skeletal structures are unremarkable. IMPRESSION: Bibasilar atelectasis, no evidence of pneumonia or large pleural effusion. Electronically Signed   By: IKeane PoliceD.O.   On: 07/17/2021 13:17    Procedures Procedures    Medications Ordered in ED Medications  cefTRIAXone (ROCEPHIN) 1 g in sodium chloride 0.9 % 100 mL IVPB (has no administration in time range)  azithromycin (ZITHROMAX) 500 mg in sodium  chloride 0.9 % 250 mL IVPB (has no administration in time range)  morphine (PF) 4 MG/ML injection 4 mg (4 mg Intravenous Given 07/17/21 1702)  iohexol (OMNIPAQUE) 350 MG/ML injection 80 mL (80 mLs Intravenous Contrast Given 07/17/21 1909)    ED Course/ Medical Decision Making/ A&P                           Medical Decision Making Amount and/or Complexity of Data Reviewed Labs: ordered. Radiology: ordered.  Risk Prescription drug management.   41 year old male presents emergency department as a transfer for a CT study to rule out PE.  He originally presented there with 3 to 4 weeks of illness, specifically right-sided chest pain in the last 5 days with hemoptysis.  Was noted to be tachycardic with an  elevated D-dimer.  Patient is tachycardic on arrival, fever of 38.2 Celsius.  CT PE study identifies pulmonary embolism with an area of atelectasis versus pneumonia or possible lung infarct.  Patient will be placed on heparin and antibiotics.  No evidence of right heart strain.  Patient is currently on room air, no hypoxia or acute respiratory distress.  We will plan for admission, heparin, antibiotics and further evaluation.  Patients evaluation and results requires admission for further treatment and care.  Spoke with hospitalist, reviewed patient's ED course and they accept admission.  Patient agrees with admission plan, offers no new complaints and is stable/unchanged at time of admit.        Final Clinical Impression(s) / ED Diagnoses Final diagnoses:  Chest pain, unspecified type    Rx / DC Orders ED Discharge Orders     None         Lorelle Gibbs, DO 07/17/21 2050

## 2021-07-18 ENCOUNTER — Observation Stay (HOSPITAL_COMMUNITY): Payer: 59

## 2021-07-18 ENCOUNTER — Other Ambulatory Visit (HOSPITAL_COMMUNITY): Payer: Self-pay

## 2021-07-18 ENCOUNTER — Observation Stay (HOSPITAL_BASED_OUTPATIENT_CLINIC_OR_DEPARTMENT_OTHER): Payer: 59

## 2021-07-18 DIAGNOSIS — Z8249 Family history of ischemic heart disease and other diseases of the circulatory system: Secondary | ICD-10-CM | POA: Diagnosis not present

## 2021-07-18 DIAGNOSIS — Z808 Family history of malignant neoplasm of other organs or systems: Secondary | ICD-10-CM | POA: Diagnosis not present

## 2021-07-18 DIAGNOSIS — R5383 Other fatigue: Secondary | ICD-10-CM | POA: Diagnosis not present

## 2021-07-18 DIAGNOSIS — I2694 Multiple subsegmental pulmonary emboli without acute cor pulmonale: Secondary | ICD-10-CM | POA: Diagnosis not present

## 2021-07-18 DIAGNOSIS — I2699 Other pulmonary embolism without acute cor pulmonale: Secondary | ICD-10-CM | POA: Diagnosis present

## 2021-07-18 DIAGNOSIS — J189 Pneumonia, unspecified organism: Secondary | ICD-10-CM | POA: Diagnosis present

## 2021-07-18 DIAGNOSIS — Z8616 Personal history of COVID-19: Secondary | ICD-10-CM | POA: Diagnosis not present

## 2021-07-18 DIAGNOSIS — B2700 Gammaherpesviral mononucleosis without complication: Secondary | ICD-10-CM | POA: Diagnosis present

## 2021-07-18 DIAGNOSIS — I2693 Single subsegmental pulmonary embolism without acute cor pulmonale: Secondary | ICD-10-CM | POA: Diagnosis present

## 2021-07-18 DIAGNOSIS — R7989 Other specified abnormal findings of blood chemistry: Secondary | ICD-10-CM | POA: Diagnosis present

## 2021-07-18 DIAGNOSIS — Z8042 Family history of malignant neoplasm of prostate: Secondary | ICD-10-CM | POA: Diagnosis not present

## 2021-07-18 DIAGNOSIS — B2790 Infectious mononucleosis, unspecified without complication: Secondary | ICD-10-CM | POA: Diagnosis not present

## 2021-07-18 LAB — HEPATITIS PANEL, ACUTE
HCV Ab: NONREACTIVE
Hep A IgM: NONREACTIVE
Hep B C IgM: NONREACTIVE
Hepatitis B Surface Ag: NONREACTIVE

## 2021-07-18 LAB — HEPARIN LEVEL (UNFRACTIONATED)
Heparin Unfractionated: 0.34 IU/mL (ref 0.30–0.70)
Heparin Unfractionated: 0.41 IU/mL (ref 0.30–0.70)

## 2021-07-18 LAB — ECHOCARDIOGRAM COMPLETE
AR max vel: 3.36 cm2
AV Peak grad: 6.8 mmHg
Ao pk vel: 1.3 m/s
Area-P 1/2: 4.86 cm2
Height: 76 in
S' Lateral: 3.1 cm
Weight: 3425.07 oz

## 2021-07-18 LAB — RESPIRATORY PANEL BY PCR

## 2021-07-18 LAB — HIV ANTIBODY (ROUTINE TESTING W REFLEX): HIV Screen 4th Generation wRfx: NONREACTIVE

## 2021-07-18 LAB — CBC
HCT: 35.8 % — ABNORMAL LOW (ref 39.0–52.0)
Hemoglobin: 12 g/dL — ABNORMAL LOW (ref 13.0–17.0)
MCH: 29.8 pg (ref 26.0–34.0)
MCHC: 33.5 g/dL (ref 30.0–36.0)
MCV: 88.8 fL (ref 80.0–100.0)
Platelets: 323 10*3/uL (ref 150–400)
RBC: 4.03 MIL/uL — ABNORMAL LOW (ref 4.22–5.81)
RDW: 12.9 % (ref 11.5–15.5)
WBC: 11.1 10*3/uL — ABNORMAL HIGH (ref 4.0–10.5)
nRBC: 0 % (ref 0.0–0.2)

## 2021-07-18 LAB — BRAIN NATRIURETIC PEPTIDE: B Natriuretic Peptide: 22.5 pg/mL (ref 0.0–100.0)

## 2021-07-18 LAB — TSH: TSH: 0.81 u[IU]/mL (ref 0.350–4.500)

## 2021-07-18 LAB — ANTITHROMBIN III: AntiThromb III Func: 113 % (ref 75–120)

## 2021-07-18 LAB — PROCALCITONIN: Procalcitonin: <0.1 ng/mL

## 2021-07-18 MED ORDER — ORAL CARE MOUTH RINSE
15.0000 mL | OROMUCOSAL | Status: DC | PRN
  Administered 2021-07-19: 15 mL via OROMUCOSAL

## 2021-07-18 MED ORDER — MORPHINE SULFATE (PF) 2 MG/ML IV SOLN
1.0000 mg | Freq: Once | INTRAVENOUS | Status: AC
  Administered 2021-07-18: 1 mg via INTRAVENOUS
  Filled 2021-07-18: qty 1

## 2021-07-18 MED ORDER — IBUPROFEN 200 MG PO TABS
400.0000 mg | ORAL_TABLET | Freq: Once | ORAL | Status: AC
  Administered 2021-07-18: 400 mg via ORAL
  Filled 2021-07-18: qty 2

## 2021-07-18 NOTE — TOC Benefit Eligibility Note (Signed)
Patient Teacher, English as a foreign language completed.    The patient is currently admitted and upon discharge could be taking warfarin (Coumadin) 5 mg.  The current 30 day co-pay is, $10.00.   The patient is currently admitted and upon discharge could be taking Eliquis 5 mg.  Prior Authorization Required  The patient is currently admitted and upon discharge could be taking Xarelto 20 mg.  Prior Authorization Required  The patient is insured through [place] Patient Advocate Team Direct Number: [phone]  Fax: [phone]

## 2021-07-18 NOTE — Progress Notes (Signed)
Bilateral lower extremity venous duplex has been completed. Preliminary results can be found in CV Proc through chart review.   07/18/21 9:03 AM Ronnie Middleton RVT

## 2021-07-18 NOTE — Progress Notes (Signed)
[facility] for IV heparin Indication: new PE  No Known Allergies  Patient Measurements: Height: '6\' 4"'$  (193 cm) Weight: 97.1 kg (214 lb 1.1 oz) IBW/kg (Calculated) : 86.8 Heparin Dosing Weight: TBW  Vital Signs: Temp: 99.5 F (37.5 C) (06/20 0354) Temp Source: Oral (06/19 1825) BP: 127/65 (06/20 0330) Pulse Rate: 101 (06/20 0330)  Labs: Recent Labs    07/17/21 1540 07/17/21 2015 07/18/21 0348  HGB 12.6*  --  12.0*  HCT 37.7*  --  35.8*  PLT 342  --  323  LABPROT 13.4  --   --   INR 1.0  --   --   HEPARINUNFRC  --   --  0.34  CREATININE 0.75  --   --   TROPONINIHS 2 3  --      Estimated Creatinine Clearance: 153.7 mL/min (by C-G formula based on SCr of 0.75 mg/dL).   Medical History: Past Medical History:  Diagnosis Date   COVID 2022   Mononucleosis    Strep throat     Medications:  (Not in a hospital admission) Scheduled:   pantoprazole  40 mg Oral Daily   PRN:   Assessment: 84 yoM presenting with 3-4 weeks of malaise, R-sided CP with hemoptysis x 5 days, tachycardia, fever, and elevated D-dimer. CTA shows multiple RLL segmental/subsegmental emboli accompanied by possible PNA or infarct; no RH strain noted. Pharmacy to dose IV heparin.  Baseline INR, aPTT: not done Prior anticoagulation: none   Today, 07/18/2021: Heparin level = 0.34 (therapeutic) with heparin gtt @ 1750 units/hr CBC: Hgb = 12 slightly low; Plt WNL No bleeding or infusion issues noted  Goal of Therapy: Heparin level 0.3-0.7 units/ml Monitor platelets by anticoagulation protocol: Yes  Plan: Continue Heparin 1750 units/hr IV infusion Recheck heparin level in 6 hrs to confirm therapeutic dose Daily CBC, daily heparin level once stable Monitor for signs of bleeding or thrombosis  Leone Haven, PharmD 07/18/2021, 4:38 AM

## 2021-07-18 NOTE — Progress Notes (Signed)
PROGRESS NOTE    Ronnie Middleton  WPY:[id number] DOB: October 19, 1980 DOA: 07/17/2021 PCP: Algis Greenhouse, MD    Brief Narrative:  Ronnie Middleton is a 41 y.o. 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  Found to have PE.     Assessment and Plan: * Pulmonary embolism ([place]) - Multiple emboli seen with largest to RLL.  No heart strain with reassuring negative troponin.  At this point, appears to be unprovoked. - bilateral venous Doppler ultrasound negative - coagulopathy labs ordered and pending -continue IV heparin infusion today, toc consult for NOAC -if symptoms improve in AM change to PO  Abnormal LFTs -has been chronically elevated since monoinfection in 2019.  Denies alcohol use.  Today AST of 56, AST is normal and an alkaline phosphatase of 169.  This is actually improve from lab work 2 weeks ago. -RUQ ultrasounds negative -hepatitis panel negative  Fatigue Patient with nonspecific symptoms of fatigue, intermittent fever with new finding of pulmonary embolism.  He has history of mono with chronic elevated LFTs.  Reportedly had negative mono tests outpatient. -will test for EBV IgM antibody, HIV negative -TSH ok -procalcitonin negative, COVID PCR and RVP negative  -d/c IV abx      DVT prophylaxis: heparin gtt    Code Status: Full Code   Disposition Plan:  Level of care: Telemetry Status is: Observation The patient will require care spanning > 2 midnights and should be moved to inpatient because: needs IV heparin    Consultants:  none   Subjective: Still feeling SOB  Objective: Vitals:   07/18/21 0330 07/18/21 0354 07/18/21 0638 07/18/21 1034  BP: 127/65  140/66 122/65  Pulse: (!) 101  96 96  Resp: (!) 23  (!) 23 (!) 22  Temp:  99.5 F (37.5 C)    TempSrc:      SpO2: 94%  94% 96%  Weight:      Height:       No intake or output data in the 24 hours  ending 07/18/21 1208 Filed Weights   07/17/21 1231  Weight: 97.1 kg    Examination:   General: Appearance:     Overweight male who appears in mild resp distress     Lungs:     Clear to auscultation bilaterally  Heart:    Normal heart rate.    MS:   All extremities are intact.    Neurologic:   Awake, alert, oriented x 3. No apparent focal neurological           defect.        Data Reviewed: I have personally reviewed following labs and imaging studies  CBC: Recent Labs  Lab 07/17/21 1540 07/18/21 0348  WBC 11.8* 11.1*  NEUTROABS 7.2  --   HGB 12.6* 12.0*  HCT 37.7* 35.8*  MCV 87.1 88.8  PLT 342 053   Basic Metabolic Panel: Recent Labs  Lab 07/17/21 1540  NA 137  K 3.7  CL 100  CO2 28  GLUCOSE 96  BUN 9  CREATININE 0.75  CALCIUM 9.3   GFR: Estimated Creatinine Clearance: 153.7 mL/min (by C-G formula based on SCr of 0.75 mg/dL). Liver Function Tests: Recent Labs  Lab 07/17/21 1540  AST 28  ALT 56*  ALKPHOS 169*  BILITOT 0.7  PROT 7.4  ALBUMIN 3.9   No results for input(s): "LIPASE", "AMYLASE" in the last 168 hours. No results  for input(s): "AMMONIA" in the last 168 hours. Coagulation Profile: Recent Labs  Lab 07/17/21 1540  INR 1.0   Cardiac Enzymes: No results for input(s): "CKTOTAL", "CKMB", "CKMBINDEX", "TROPONINI" in the last 168 hours. BNP (last 3 results) No results for input(s): "PROBNP" in the last 8760 hours. HbA1C: No results for input(s): "HGBA1C" in the last 72 hours. CBG: No results for input(s): "GLUCAP" in the last 168 hours. Lipid Profile: No results for input(s): "CHOL", "HDL", "LDLCALC", "TRIG", "CHOLHDL", "LDLDIRECT" in the last 72 hours. Thyroid Function Tests: Recent Labs    07/18/21 0348  TSH 0.810   Anemia Panel: No results for input(s): "VITAMINB12", "FOLATE", "FERRITIN", "TIBC", "IRON", "RETICCTPCT" in the last 72 hours. Sepsis Labs: Recent Labs  Lab 07/17/21 2256  PROCALCITON <0.10    Recent Results  (from the past 240 hour(s))  SARS Coronavirus 2 by RT PCR (hospital order, performed in [facility] hospital lab) *cepheid single result test* Anterior Nasal Swab     Status: None   Collection Time: 07/17/21  9:42 PM   Specimen: Anterior Nasal Swab  Result Value Ref Range Status   SARS Coronavirus 2 by RT PCR NEGATIVE NEGATIVE Final    Comment: (NOTE) SARS-CoV-2 target nucleic acids are NOT DETECTED.  The SARS-CoV-2 RNA is generally detectable in upper and lower respiratory specimens during the acute phase of infection. The lowest concentration of SARS-CoV-2 viral copies this assay can detect is 250 copies / mL. A negative result does not preclude SARS-CoV-2 infection and should not be used as the sole basis for treatment or other patient management decisions.  A negative result may occur with improper specimen collection / handling, submission of specimen other than nasopharyngeal swab, presence of viral mutation(s) within the areas targeted by this assay, and inadequate number of viral copies (<250 copies / mL). A negative result must be combined with clinical observations, patient history, and epidemiological information.  Fact Sheet for Patients:   https://www.patel.info/  Fact Sheet for Healthcare Providers: https://hall.com/  This test is not yet approved or  cleared by the [place] FDA and has been authorized for detection and/or diagnosis of SARS-CoV-2 by FDA under an Emergency Use Authorization (EUA).  This EUA will remain in effect (meaning this test can be used) for the duration of the COVID-19 declaration under Section 564(b)(1) of the Act, 21 U.S.C. section 360bbb-3(b)(1), unless the authorization is terminated or revoked sooner.  Performed at [facility], [place] [address] [place], [place] [address]   Respiratory (~20 pathogens) panel by PCR     Status: None   Collection Time: 07/17/21 10:20 PM    Specimen: Nasopharyngeal Swab; Respiratory  Result Value Ref Range Status   Adenovirus NOT DETECTED NOT DETECTED Final   Coronavirus 229E NOT DETECTED NOT DETECTED Final    Comment: (NOTE) The Coronavirus on the Respiratory Panel, DOES NOT test for the novel  Coronavirus (2019 nCoV)    Coronavirus HKU1 NOT DETECTED NOT DETECTED Final   Coronavirus NL63 NOT DETECTED NOT DETECTED Final   Coronavirus OC43 NOT DETECTED NOT DETECTED Final   Metapneumovirus NOT DETECTED NOT DETECTED Final   Rhinovirus / Enterovirus NOT DETECTED NOT DETECTED Final   Influenza A NOT DETECTED NOT DETECTED Final   Influenza B NOT DETECTED NOT DETECTED Final   Parainfluenza Virus 1 NOT DETECTED NOT DETECTED Final   Parainfluenza Virus 2 NOT DETECTED NOT DETECTED Final   Parainfluenza Virus 3 NOT DETECTED NOT DETECTED Final   Parainfluenza Virus 4 NOT DETECTED  NOT DETECTED Final   Respiratory Syncytial Virus NOT DETECTED NOT DETECTED Final   Bordetella pertussis NOT DETECTED NOT DETECTED Final   Bordetella Parapertussis NOT DETECTED NOT DETECTED Final   Chlamydophila pneumoniae NOT DETECTED NOT DETECTED Final   Mycoplasma pneumoniae NOT DETECTED NOT DETECTED Final    Comment: Performed at [facility], [place] [address] [place], [place] [address]         Radiology Studies: ECHOCARDIOGRAM COMPLETE  Result Date: 07/18/2021    ECHOCARDIOGRAM REPORT   Patient Name:   Ronnie Middleton Date of Exam: 07/18/2021 Medical Rec #:  [id number]       Height:       76.0 in Accession #:    [id number]      Weight:       214.1 lb Date of Birth:  1980-06-30       BSA:          2.280 m Patient Age:    50 years        BP:           140/66 mmHg Patient Gender: M               HR:           97 bpm. Exam Location:  Inpatient Procedure: 2D Echo, Cardiac Doppler and Color Doppler Indications:    Pulmonary embolism  History:        Patient has no prior history of Echocardiogram examinations.  Sonographer:    Jefferey Pica Referring  Phys: [place]  Sonographer Comments: Study was technically difficult. Exam was performed with patient sitting up. IMPRESSIONS  1. Left ventricular ejection fraction, by estimation, is 65 to 70%. The left ventricle has normal function. The left ventricle has no regional wall motion abnormalities. Left ventricular diastolic parameters were normal.  2. Right ventricular systolic function is normal. The right ventricular size is normal. Tricuspid regurgitation signal is inadequate for assessing PA pressure.  3. The mitral valve is normal in structure. No evidence of mitral valve regurgitation. No evidence of mitral stenosis.  4. The aortic valve is tricuspid. Aortic valve regurgitation is not visualized. No aortic stenosis is present.  5. The inferior vena cava is normal in size with greater than 50% respiratory variability, suggesting right atrial pressure of 3 mmHg. Comparison(s): No prior Echocardiogram. FINDINGS  Left Ventricle: Left ventricular ejection fraction, by estimation, is 65 to 70%. The left ventricle has normal function. The left ventricle has no regional wall motion abnormalities. The left ventricular internal cavity size was normal in size. There is  no left ventricular hypertrophy. Left ventricular diastolic parameters were normal. Right Ventricle: The right ventricular size is normal. No increase in right ventricular wall thickness. Right ventricular systolic function is normal. Tricuspid regurgitation signal is inadequate for assessing PA pressure. Left Atrium: Left atrial size was normal in size. Right Atrium: Right atrial size was normal in size. Pericardium: There is no evidence of pericardial effusion. Mitral Valve: The mitral valve is normal in structure. No evidence of mitral valve regurgitation. No evidence of mitral valve stenosis. Tricuspid Valve: The tricuspid valve is normal in structure. Tricuspid valve regurgitation is not demonstrated. No evidence of tricuspid stenosis.  Aortic Valve: The aortic valve is tricuspid. Aortic valve regurgitation is not visualized. No aortic stenosis is present. Aortic valve peak gradient measures 6.8 mmHg. Pulmonic Valve: The pulmonic valve was not well visualized. Pulmonic valve regurgitation is not visualized. No evidence of pulmonic stenosis.  Aorta: The aortic root and ascending aorta are structurally normal, with no evidence of dilitation. Venous: The inferior vena cava is normal in size with greater than 50% respiratory variability, suggesting right atrial pressure of 3 mmHg. IAS/Shunts: No atrial level shunt detected by color flow Doppler.  LEFT VENTRICLE PLAX 2D LVIDd:         4.80 cm   Diastology LVIDs:         3.10 cm   LV e' medial:    11.50 cm/s LV PW:         0.90 cm   LV E/e' medial:  6.7 LV IVS:        1.10 cm   LV e' lateral:   17.30 cm/s LVOT diam:     2.10 cm   LV E/e' lateral: 4.5 LV SV:         65 LV SV Index:   29 LVOT Area:     3.46 cm  RIGHT VENTRICLE             IVC RV S prime:     12.60 cm/s  IVC diam: 2.00 cm TAPSE (M-mode): 2.8 cm LEFT ATRIUM             Index        RIGHT ATRIUM           Index LA diam:        2.90 cm 1.27 cm/m   RA Area:     16.40 cm LA Vol (A2C):   49.8 ml 21.84 ml/m  RA Volume:   42.70 ml  18.73 ml/m LA Vol (A4C):   32.7 ml 14.34 ml/m LA Biplane Vol: 40.7 ml 17.85 ml/m  AORTIC VALVE                 PULMONIC VALVE AV Area (Vmax): 3.36 cm     PV Vmax:       0.94 m/s AV Vmax:        130.00 cm/s  PV Peak grad:  3.5 mmHg AV Peak Grad:   6.8 mmHg LVOT Vmax:      126.00 cm/s LVOT Vmean:     61.100 cm/s LVOT VTI:       0.188 m  AORTA Ao Asc diam: 2.80 cm MITRAL VALVE MV Area (PHT): 4.86 cm    SHUNTS MV Decel Time: 156 msec    Systemic VTI:  0.19 m MV E velocity: 77.10 cm/s  Systemic Diam: 2.10 cm MV A velocity: 82.40 cm/s MV E/A ratio:  0.94 Rudean Haskell MD Electronically signed by Rudean Haskell MD Signature Date/Time: 07/18/2021/10:34:39 AM    Final    VAS [place] LOWER EXTREMITY VENOUS  (DVT)  Result Date: 07/18/2021  Lower Venous DVT Study Patient Name:  Ronnie Middleton  Date of Exam:   07/18/2021 Medical Rec #: [id number]        Accession #:    [id number] Date of Birth: 1980-08-13        Patient Gender: M Patient Age:   52 years Exam Location:  [facility] Procedure:      VAS [place] LOWER EXTREMITY VENOUS (DVT) Referring Phys: CHING TU --------------------------------------------------------------------------------  Indications: Pulmonary embolism.  Risk Factors: Confirmed PE. Anticoagulation: Heparin. Comparison Study: No prior studies. Performing Technologist: Oliver Hum RVT  Examination Guidelines: A complete evaluation includes B-mode imaging, spectral Doppler, color Doppler, and power Doppler as needed of all accessible portions of each vessel. Bilateral testing is considered an integral part of a complete  examination. Limited examinations for reoccurring indications may be performed as noted. The reflux portion of the exam is performed with the patient in reverse Trendelenburg.  +---------+---------------+---------+-----------+----------+--------------+ RIGHT    CompressibilityPhasicitySpontaneityPropertiesThrombus Aging +---------+---------------+---------+-----------+----------+--------------+ CFV      Full           Yes      Yes                                 +---------+---------------+---------+-----------+----------+--------------+ SFJ      Full                                                        +---------+---------------+---------+-----------+----------+--------------+ FV Prox  Full                                                        +---------+---------------+---------+-----------+----------+--------------+ FV Mid   Full                                                        +---------+---------------+---------+-----------+----------+--------------+ FV DistalFull                                                         +---------+---------------+---------+-----------+----------+--------------+ PFV      Full                                                        +---------+---------------+---------+-----------+----------+--------------+ POP      Full           Yes      Yes                                 +---------+---------------+---------+-----------+----------+--------------+ PTV      Full                                                        +---------+---------------+---------+-----------+----------+--------------+ PERO     Full                                                        +---------+---------------+---------+-----------+----------+--------------+   +---------+---------------+---------+-----------+----------+--------------+ LEFT     CompressibilityPhasicitySpontaneityPropertiesThrombus Aging +---------+---------------+---------+-----------+----------+--------------+ CFV      Full  Yes      Yes                                 +---------+---------------+---------+-----------+----------+--------------+ SFJ      Full                                                        +---------+---------------+---------+-----------+----------+--------------+ FV Prox  Full                                                        +---------+---------------+---------+-----------+----------+--------------+ FV Mid   Full                                                        +---------+---------------+---------+-----------+----------+--------------+ FV DistalFull                                                        +---------+---------------+---------+-----------+----------+--------------+ PFV      Full                                                        +---------+---------------+---------+-----------+----------+--------------+ POP      Full           Yes      Yes                                  +---------+---------------+---------+-----------+----------+--------------+ PTV      Full                                                        +---------+---------------+---------+-----------+----------+--------------+ PERO     Full                                                        +---------+---------------+---------+-----------+----------+--------------+     Summary: RIGHT: - There is no evidence of deep vein thrombosis in the lower extremity.  - No cystic structure found in the popliteal fossa.  LEFT: - There is no evidence of deep vein thrombosis in the lower extremity.  - No cystic structure found in the popliteal fossa.  *See table(s) above for measurements and observations. Electronically signed by Deitra Mayo MD on 07/18/2021 at 10:33:39  AM.    Final    CT Angio Chest PE W/Cm &/Or Wo Cm  Result Date: 07/17/2021 CLINICAL DATA:  Suspected pulmonary embolism, positive D-dimer EXAM: CT ANGIOGRAPHY CHEST WITH CONTRAST TECHNIQUE: Multidetector CT imaging of the chest was performed using the standard protocol during bolus administration of intravenous contrast. Multiplanar CT image reconstructions and MIPs were obtained to evaluate the vascular anatomy. RADIATION DOSE REDUCTION: This exam was performed according to the departmental dose-optimization program which includes automated exposure control, adjustment of the mA and/or kV according to patient size and/or use of iterative reconstruction technique. CONTRAST:  33m OMNIPAQUE IOHEXOL 350 MG/ML SOLN COMPARISON:  Chest x-ray earlier the same day FINDINGS: Cardiovascular: Multiple pulmonary artery filling defects identified consistent with emboli, largest in the right lower lobe proximal segmental to subsegmental branches. Additional small emboli are mostly subsegmental. No saddle embolism identified. Main pulmonary artery is normal caliber. Heart size is normal. No pericardial effusion. No evidence of right heart strain. Thoracic  aorta is normal in course and caliber. Mediastinum/Nodes: No bulky axillary, hilar or mediastinal lymphadenopathy identified. Lungs/Pleura: Irregular ground-glass airspace densities the basilar and dependent aspect of the right lower lobe with adjacent subsegmental atelectatic changes. Small right pleural effusion. No pneumothorax. Upper Abdomen: Tiny hiatal hernia.  No acute process identified. Musculoskeletal: No chest wall abnormality. No acute or significant osseous findings. Review of the MIP images confirms the above findings. IMPRESSION: 1. Positive for pulmonary emboli, most prominent in the right lower lobe as described. No saddle embolism or evidence of right heart strain. 2. Airspace densities at the base of the right lower lobe which may represent atelectasis and infiltrate. 3. Small right pleural effusion. Findings discussed with Dr. HDina Richover the telephone at 7:31 p.m. on 07/17/2021 with read back. Electronically Signed   By: DOfilia NeasM.D.   On: 07/17/2021 19:32   [place] Limited RUQ (LIVER/GB)  Result Date: 07/17/2021 CLINICAL DATA:  Elevated LFTs. EXAM: ULTRASOUND ABDOMEN LIMITED RIGHT UPPER QUADRANT COMPARISON:  CT of the abdomen and pelvis 07/04/2021 FINDINGS: Gallbladder: The patient admits to eating 2-3 hours prior to the exam. The gallbladder is somewhat contracted. Wall thickness is within normal limits. No sonographic MPercell Millersign is present. No stones. Common bile duct: Diameter: 0.6 mm, within normal limits Liver: No focal lesion identified. Within normal limits in parenchymal echogenicity. Portal vein is patent on color Doppler imaging with normal direction of blood flow towards the liver. Other: None. IMPRESSION: Negative right upper quadrant ultrasound. Electronically Signed   By: CSan MorelleM.D.   On: 07/17/2021 16:54   DG Chest 2 View  Result Date: 07/17/2021 CLINICAL DATA:  Hemoptysis. EXAM: CHEST - 2 VIEW COMPARISON:  Chest radiograph dated July 04, 2021  FINDINGS: The heart size and mediastinal contours are within normal limits. Bibasilar atelectasis. No focal consolidation or large pleural effusion. The visualized skeletal structures are unremarkable. IMPRESSION: Bibasilar atelectasis, no evidence of pneumonia or large pleural effusion. Electronically Signed   By: IKeane PoliceD.O.   On: 07/17/2021 13:17        Scheduled Meds:  pantoprazole  40 mg Oral Daily   Continuous Infusions:  azithromycin     cefTRIAXone (ROCEPHIN)  IV Stopped (07/17/21 2115)   heparin 1,750 Units/hr (07/18/21 0947)     LOS: 0 days    Time spent: 45 minutes spent on chart review, discussion with nursing staff, consultants, updating family and interview/physical exam; more than 50% of that time was spent in counseling and/or coordination of  care.    Geradine Girt, DO [facility] Available via Epic secure chat 7am-7pm After these hours, please refer to coverage provider listed on [email or web address] 07/18/2021, 12:08 PM

## 2021-07-18 NOTE — Plan of Care (Signed)

## 2021-07-18 NOTE — ED Notes (Addendum)
Ronnie Middleton, [facility] confirmed a recent theraputic heparin level, with no change on the current heparin drip @ 1750units/hr

## 2021-07-18 NOTE — Progress Notes (Signed)
[place] for IV heparin Indication: new PE  No Known Allergies  Patient Measurements: Height: '6\' 4"'$  (193 cm) Weight: 97.1 kg (214 lb 1.1 oz) IBW/kg (Calculated) : 86.8 Heparin Dosing Weight: 97.1 kg  Vital Signs: Temp: 99.5 F (37.5 C) (06/20 0354) BP: 122/65 (06/20 1034) Pulse Rate: 96 (06/20 1034)  Labs: Recent Labs    07/17/21 1540 07/17/21 2015 07/18/21 0348 07/18/21 1012  HGB 12.6*  --  12.0*  --   HCT 37.7*  --  35.8*  --   PLT 342  --  323  --   LABPROT 13.4  --   --   --   INR 1.0  --   --   --   HEPARINUNFRC  --   --  0.34 0.41  CREATININE 0.75  --   --   --   TROPONINIHS 2 3  --   --      Estimated Creatinine Clearance: 153.7 mL/min (by C-G formula based on SCr of 0.75 mg/dL).   Medical History: Past Medical History:  Diagnosis Date   COVID 2022   Mononucleosis    Strep throat     Medications:  (Not in a hospital admission) Scheduled:   pantoprazole  40 mg Oral Daily   PRN:   Assessment: 61 yoM presenting with 3-4 weeks of malaise, R-sided CP with hemoptysis x 5 days, tachycardia, fever, and elevated D-dimer. CTA shows multiple RLL segmental/subsegmental emboli accompanied by possible PNA or infarct; no RH strain noted. Pharmacy to dose IV heparin.  Baseline INR, aPTT: not done Prior anticoagulation: none   Today, 07/18/2021: 10:12 confirmatory Heparin level = 0.41 (therapeutic) with heparin infusing at 1750 units/hr CBC: Hgb = 12 slightly low; Plt WNL No bleeding or infusion issues noted  Goal of Therapy: Heparin level 0.3-0.7 units/ml Monitor platelets by anticoagulation protocol: Yes  Plan: Continue Heparin 1750 units/hr IV infusion Monitor daily heparin level, CBC, signs/symptoms of bleeding    Royetta Asal, PharmD, [employer] Please utilize Amion for appropriate phone number to reach the unit pharmacist ([place]) 07/18/2021 12:15 PM

## 2021-07-19 ENCOUNTER — Inpatient Hospital Stay (HOSPITAL_COMMUNITY): Payer: 59

## 2021-07-19 ENCOUNTER — Telehealth: Payer: Self-pay | Admitting: Physician Assistant

## 2021-07-19 ENCOUNTER — Other Ambulatory Visit (HOSPITAL_COMMUNITY): Payer: Self-pay

## 2021-07-19 ENCOUNTER — Encounter (HOSPITAL_COMMUNITY): Payer: Self-pay | Admitting: Internal Medicine

## 2021-07-19 DIAGNOSIS — J189 Pneumonia, unspecified organism: Secondary | ICD-10-CM

## 2021-07-19 DIAGNOSIS — I2694 Multiple subsegmental pulmonary emboli without acute cor pulmonale: Secondary | ICD-10-CM | POA: Diagnosis not present

## 2021-07-19 DIAGNOSIS — I2699 Other pulmonary embolism without acute cor pulmonale: Secondary | ICD-10-CM | POA: Diagnosis not present

## 2021-07-19 DIAGNOSIS — R5383 Other fatigue: Secondary | ICD-10-CM | POA: Diagnosis not present

## 2021-07-19 DIAGNOSIS — M549 Dorsalgia, unspecified: Secondary | ICD-10-CM

## 2021-07-19 DIAGNOSIS — M545 Low back pain, unspecified: Secondary | ICD-10-CM

## 2021-07-19 DIAGNOSIS — R7989 Other specified abnormal findings of blood chemistry: Secondary | ICD-10-CM | POA: Diagnosis not present

## 2021-07-19 LAB — HEPATIC FUNCTION PANEL
ALT: 89 U/L — ABNORMAL HIGH (ref 0–44)
AST: 59 U/L — ABNORMAL HIGH (ref 15–41)
Albumin: 3.2 g/dL — ABNORMAL LOW (ref 3.5–5.0)
Alkaline Phosphatase: 194 U/L — ABNORMAL HIGH (ref 38–126)
Bilirubin, Direct: 0.2 mg/dL (ref 0.0–0.2)
Indirect Bilirubin: 0.4 mg/dL (ref 0.3–0.9)
Total Bilirubin: 0.6 mg/dL (ref 0.3–1.2)
Total Protein: 6.9 g/dL (ref 6.5–8.1)

## 2021-07-19 LAB — CBC
HCT: 37.1 % — ABNORMAL LOW (ref 39.0–52.0)
Hemoglobin: 12.2 g/dL — ABNORMAL LOW (ref 13.0–17.0)
MCH: 30 pg (ref 26.0–34.0)
MCHC: 32.9 g/dL (ref 30.0–36.0)
MCV: 91.2 fL (ref 80.0–100.0)
Platelets: 329 10*3/uL (ref 150–400)
RBC: 4.07 MIL/uL — ABNORMAL LOW (ref 4.22–5.81)
RDW: 13.1 % (ref 11.5–15.5)
WBC: 8.1 10*3/uL (ref 4.0–10.5)
nRBC: 0 % (ref 0.0–0.2)

## 2021-07-19 LAB — BASIC METABOLIC PANEL
Anion gap: 7 (ref 5–15)
BUN: 10 mg/dL (ref 6–20)
CO2: 28 mmol/L (ref 22–32)
Calcium: 8.5 mg/dL — ABNORMAL LOW (ref 8.9–10.3)
Chloride: 106 mmol/L (ref 98–111)
Creatinine, Ser: 0.81 mg/dL (ref 0.61–1.24)
GFR, Estimated: >60 mL/min (ref >60)
Glucose, Bld: 101 mg/dL — ABNORMAL HIGH (ref 70–99)
Potassium: 3.6 mmol/L (ref 3.5–5.1)
Sodium: 141 mmol/L (ref 135–145)

## 2021-07-19 LAB — HEPARIN LEVEL (UNFRACTIONATED): Heparin Unfractionated: 0.55 IU/mL (ref 0.30–0.70)

## 2021-07-19 LAB — EPSTEIN-BARR VIRUS VCA, IGM: EBV VCA IgM: >160 U/mL — ABNORMAL HIGH (ref 0.0–35.9)

## 2021-07-19 LAB — ANA W/REFLEX IF POSITIVE: Anti Nuclear Antibody (ANA): NEGATIVE

## 2021-07-19 LAB — PROCALCITONIN: Procalcitonin: <0.1 ng/mL

## 2021-07-19 MED ORDER — ACETAMINOPHEN 325 MG PO TABS: 650.0000 mg | ORAL_TABLET | Freq: Four times a day (QID) | ORAL | Status: AC | PRN

## 2021-07-19 MED ORDER — APIXABAN 5 MG PO TABS
10.0000 mg | ORAL_TABLET | Freq: Two times a day (BID) | ORAL | Status: DC
  Administered 2021-07-19 – 2021-07-20 (×3): 10 mg via ORAL
  Filled 2021-07-19 (×3): qty 2

## 2021-07-19 MED ORDER — APIXABAN 5 MG PO TABS: 5.0000 mg | ORAL_TABLET | Freq: Two times a day (BID) | ORAL | Status: DC

## 2021-07-19 MED ORDER — MORPHINE SULFATE (PF) 2 MG/ML IV SOLN
1.0000 mg | Freq: Once | INTRAVENOUS | Status: AC
  Administered 2021-07-19: 1 mg via INTRAVENOUS
  Filled 2021-07-19: qty 1

## 2021-07-19 MED ORDER — SODIUM CHLORIDE 0.9 % IV SOLN
500.0000 mg | INTRAVENOUS | Status: DC
  Administered 2021-07-19 – 2021-07-20 (×2): 500 mg via INTRAVENOUS
  Filled 2021-07-19 (×2): qty 5

## 2021-07-19 MED ORDER — METHOCARBAMOL 500 MG PO TABS
500.0000 mg | ORAL_TABLET | Freq: Four times a day (QID) | ORAL | Status: DC
  Administered 2021-07-19 – 2021-07-20 (×5): 500 mg via ORAL
  Filled 2021-07-19 (×5): qty 1

## 2021-07-19 MED ORDER — ADULT MULTIVITAMIN W/MINERALS CH
1.0000 | ORAL_TABLET | Freq: Every day | ORAL | Status: DC
  Administered 2021-07-19 – 2021-07-20 (×2): 1 via ORAL
  Filled 2021-07-19 (×2): qty 1

## 2021-07-19 MED ORDER — ENSURE MAX PROTEIN PO LIQD
11.0000 [oz_av] | Freq: Every day | ORAL | Status: DC
  Administered 2021-07-19: 11 [oz_av] via ORAL
  Filled 2021-07-19 (×2): qty 330

## 2021-07-19 MED ORDER — KETOROLAC TROMETHAMINE 15 MG/ML IJ SOLN
15.0000 mg | Freq: Once | INTRAMUSCULAR | Status: AC
  Administered 2021-07-19: 15 mg via INTRAVENOUS
  Filled 2021-07-19: qty 1

## 2021-07-19 MED ORDER — ENSURE MAX PROTEIN PO LIQD: 11.0000 [oz_av] | Freq: Every day | ORAL | Status: DC

## 2021-07-19 MED ORDER — SODIUM CHLORIDE 0.9 % IV SOLN
2.0000 g | INTRAVENOUS | Status: DC
  Administered 2021-07-19 – 2021-07-20 (×2): 2 g via INTRAVENOUS
  Filled 2021-07-19 (×2): qty 20

## 2021-07-19 NOTE — Telephone Encounter (Signed)
Scheduled appt per 6/21 referral. Pt is aware of appt date and time. Pt is aware to arrive 15 mins prior to appt time and to bring and updated insurance card. Pt is aware of appt location.   

## 2021-07-19 NOTE — Progress Notes (Signed)
PROGRESS NOTE  JOHNTAE Middleton QPY:[id number] DOB: Jun 03, 1980   PCP: Algis Greenhouse, MD  Patient is from: Home.   DOA: 07/17/2021 LOS: 1  Chief complaints Chief Complaint  Patient presents with   Hemoptysis     Brief Narrative / Interim history: 41 year old M with PMH of mono in 2019 with subsequent chronic elevated LFT and COVID-19 in 2022 presenting with hemoptysis and fatigue, and found to have subsegmental PE.  He was started on IV heparin.  Lower extremity venous Doppler negative for DVT.  Coag labs pending.   Patient continues to endorse right back pain that is worse with lying down.  CT renal stone study showed RLL infiltrate consistent with pneumonia.  Patient was started on IV ceftriaxone and azithromycin.   Subjective: Seen and examined earlier this morning.  No major events overnight of this morning.  He had a right lower back pain radiating laterally.  Pain is worse with lying down but not deep breathing or cough.  He denies UTI symptoms or hematuria.  Denies history of trauma or injury.  Objective: Vitals:   07/18/21 2324 07/19/21 0125 07/19/21 0300 07/19/21 0823  BP: 101/60 112/69 107/68 112/66  Pulse: 86 80 83 92  Resp: 20   16  Temp: 98.3 F (36.8 C)  97.9 F (36.6 C) 99.2 F (37.3 C)  TempSrc:   Oral Oral  SpO2: 97%  97% 97%  Weight:      Height:        Examination:  GENERAL: No apparent distress.  Nontoxic. HEENT: MMM.  Vision and hearing grossly intact.  NECK: Supple.  No apparent JVD.  RESP:  No IWOB.  Fair aeration bilaterally. CVS:  RRR. Heart sounds normal.  ABD/GI/GU: BS+. Abd soft, NTND.  No CVA tenderness. MSK/EXT:  Moves extremities. No apparent deformity. No edema.  No spinous or paraspinal muscle tenderness. SKIN: no apparent skin lesion or wound NEURO: Awake, alert and oriented appropriately.  No apparent focal neuro deficit. PSYCH: Calm. Normal affect.   Procedures:  None  Microbiology summarized: TIWPY-09 and full RVP  negative.  Assessment and plan: Principal Problem:   Pulmonary embolism (HCC) Active Problems:   Fatigue   Abnormal LFTs   Pulmonary emboli (HCC)   Acute pulmonary embolism: Seems unprovoked.  Denies recent travel, immobility and surgery.  No prior history of blood clots.  No history of cancer.  Family history negative.  CTA chest with multiple emboli seen with the largest to RLL.  Bilateral LE Doppler negative for DVT.   -Follow coagulopathy labs. -Transition to p.o. Eliquis -Ambulatory referral to hematology  Community-acquired pneumonia: CT renal stone study with RLL infiltrate concerning for pneumonia.  He has crackles over RLL. -Start ceftriaxone and azithromycin -Incentive spirometry  Fatigue: Likely due to the above 2.  TSH within normal. -Management as above   Elevated LFT: Chronic issue.  HIV and acute hepatitis panel negative.  RUQ [place] negative.  Abdominal exam benign.  No nausea or vomiting. -Follow EBV IgM  Right back pain: Could be due to pneumonia but very atypical for pneumonia and PE based on patient's description.  Has no CVA tenderness.  No UTI symptoms.  UA on 6/6 was negative. -Treat pneumonia and PE as above -Encourage OOB -Trial of Robaxin  Hemoptysis: Likely due to PE and pneumonia.  Doubt autoimmune process.  Seems to have resolved.   DVT prophylaxis:  On full dose anticoagulation for PE  Code Status: Full code Family Communication: None at bedside Level of  care: Telemetry Status is: Inpatient Remains inpatient appropriate because: Due to community-acquired pneumonia and acute pulmonary embolism   Final disposition: Home Consultants:  None  Sch Meds:  Scheduled Meds:  methocarbamol  500 mg Oral QID   pantoprazole  40 mg Oral Daily   Continuous Infusions:  azithromycin     cefTRIAXone (ROCEPHIN)  IV     heparin 1,750 Units/hr (07/19/21 0102)   PRN Meds:.acetaminophen, mouth rinse  Antimicrobials: Anti-infectives (From admission,  onward)    Start     Dose/Rate Route Frequency Ordered Stop   07/19/21 1215  cefTRIAXone (ROCEPHIN) 2 g in sodium chloride 0.9 % 100 mL IVPB        2 g 200 mL/hr over 30 Minutes Intravenous Every 24 hours 07/19/21 1115 07/24/21 1159   07/19/21 1215  azithromycin (ZITHROMAX) 500 mg in sodium chloride 0.9 % 250 mL IVPB        500 mg 250 mL/hr over 60 Minutes Intravenous Every 24 hours 07/19/21 1115 07/22/21 1159   07/18/21 2200  azithromycin (ZITHROMAX) 500 mg in sodium chloride 0.9 % 250 mL IVPB  Status:  Discontinued        500 mg 250 mL/hr over 60 Minutes Intravenous Every 24 hours 07/17/21 2349 07/18/21 1211   07/17/21 2045  cefTRIAXone (ROCEPHIN) 1 g in sodium chloride 0.9 % 100 mL IVPB  Status:  Discontinued        1 g 200 mL/hr over 30 Minutes Intravenous Every 24 hours 07/17/21 2032 07/18/21 1211   07/17/21 2045  azithromycin (ZITHROMAX) 500 mg in sodium chloride 0.9 % 250 mL IVPB        500 mg 250 mL/hr over 60 Minutes Intravenous  Once 07/17/21 2032 07/17/21 2302        I have personally reviewed the following labs and images: CBC: Recent Labs  Lab 07/17/21 1540 07/18/21 0348 07/19/21 0529  WBC 11.8* 11.1* 8.1  NEUTROABS 7.2  --   --   HGB 12.6* 12.0* 12.2*  HCT 37.7* 35.8* 37.1*  MCV 87.1 88.8 91.2  PLT 342 323 329   BMP &GFR Recent Labs  Lab 07/17/21 1540 07/19/21 0529  NA 137 141  K 3.7 3.6  CL 100 106  CO2 28 28  GLUCOSE 96 101*  BUN 9 10  CREATININE 0.75 0.81  CALCIUM 9.3 8.5*   Estimated Creatinine Clearance: 151.8 mL/min (by C-G formula based on SCr of 0.81 mg/dL). Liver & Pancreas: Recent Labs  Lab 07/17/21 1540 07/19/21 0529  AST 28 59*  ALT 56* 89*  ALKPHOS 169* 194*  BILITOT 0.7 0.6  PROT 7.4 6.9  ALBUMIN 3.9 3.2*   No results for input(s): "LIPASE", "AMYLASE" in the last 168 hours. No results for input(s): "AMMONIA" in the last 168 hours. Diabetic: No results for input(s): "HGBA1C" in the last 72 hours. No results for input(s):  "GLUCAP" in the last 168 hours. Cardiac Enzymes: No results for input(s): "CKTOTAL", "CKMB", "CKMBINDEX", "TROPONINI" in the last 168 hours. No results for input(s): "PROBNP" in the last 8760 hours. Coagulation Profile: Recent Labs  Lab 07/17/21 1540  INR 1.0   Thyroid Function Tests: Recent Labs    07/18/21 0348  TSH 0.810   Lipid Profile: No results for input(s): "CHOL", "HDL", "LDLCALC", "TRIG", "CHOLHDL", "LDLDIRECT" in the last 72 hours. Anemia Panel: No results for input(s): "VITAMINB12", "FOLATE", "FERRITIN", "TIBC", "IRON", "RETICCTPCT" in the last 72 hours. Urine analysis:    Component Value Date/Time   COLORURINE YELLOW 07/04/2021 1407  APPEARANCEUR CLEAR 07/04/2021 1407   LABSPEC 1.010 07/04/2021 1407   PHURINE 5.5 07/04/2021 1407   [place] 07/04/2021 1407   [place] 07/04/2021 [place] 07/04/2021 [place] 07/04/2021 1407   PROTEINUR NEGATIVE 07/04/2021 1407   NITRITE NEGATIVE 07/04/2021 1407   LEUKOCYTESUR NEGATIVE 07/04/2021 1407   Sepsis Labs: Invalid input(s): "PROCALCITONIN", "LACTICIDVEN"  Microbiology: Recent Results (from the past 240 hour(s))  SARS Coronavirus 2 by RT PCR (hospital order, performed in [facility] hospital lab) *cepheid single result test* Anterior Nasal Swab     Status: None   Collection Time: 07/17/21  9:42 PM   Specimen: Anterior Nasal Swab  Result Value Ref Range Status   SARS Coronavirus 2 by RT PCR NEGATIVE NEGATIVE Final    Comment: (NOTE) SARS-CoV-2 target nucleic acids are NOT DETECTED.  The SARS-CoV-2 RNA is generally detectable in upper and lower respiratory specimens during the acute phase of infection. The lowest concentration of SARS-CoV-2 viral copies this assay can detect is 250 copies / mL. A negative result does not preclude SARS-CoV-2 infection and should not be used as the sole basis for treatment or other patient management decisions.  A negative result  may occur with improper specimen collection / handling, submission of specimen other than nasopharyngeal swab, presence of viral mutation(s) within the areas targeted by this assay, and inadequate number of viral copies (<250 copies / mL). A negative result must be combined with clinical observations, patient history, and epidemiological information.  Fact Sheet for Patients:   https://www.patel.info/  Fact Sheet for Healthcare Providers: https://hall.com/  This test is not yet approved or  cleared by the [place] FDA and has been authorized for detection and/or diagnosis of SARS-CoV-2 by FDA under an Emergency Use Authorization (EUA).  This EUA will remain in effect (meaning this test can be used) for the duration of the COVID-19 declaration under Section 564(b)(1) of the Act, 21 U.S.C. section 360bbb-3(b)(1), unless the authorization is terminated or revoked sooner.  Performed at [facility], [place] [address] [place], [place] [address]   Respiratory (~20 pathogens) panel by PCR     Status: None   Collection Time: 07/17/21 10:20 PM   Specimen: Nasopharyngeal Swab; Respiratory  Result Value Ref Range Status   Adenovirus NOT DETECTED NOT DETECTED Final   Coronavirus 229E NOT DETECTED NOT DETECTED Final    Comment: (NOTE) The Coronavirus on the Respiratory Panel, DOES NOT test for the novel  Coronavirus (2019 nCoV)    Coronavirus HKU1 NOT DETECTED NOT DETECTED Final   Coronavirus NL63 NOT DETECTED NOT DETECTED Final   Coronavirus OC43 NOT DETECTED NOT DETECTED Final   Metapneumovirus NOT DETECTED NOT DETECTED Final   Rhinovirus / Enterovirus NOT DETECTED NOT DETECTED Final   Influenza A NOT DETECTED NOT DETECTED Final   Influenza B NOT DETECTED NOT DETECTED Final   Parainfluenza Virus 1 NOT DETECTED NOT DETECTED Final   Parainfluenza Virus 2 NOT DETECTED NOT DETECTED Final   Parainfluenza Virus 3 NOT DETECTED  NOT DETECTED Final   Parainfluenza Virus 4 NOT DETECTED NOT DETECTED Final   Respiratory Syncytial Virus NOT DETECTED NOT DETECTED Final   Bordetella pertussis NOT DETECTED NOT DETECTED Final   Bordetella Parapertussis NOT DETECTED NOT DETECTED Final   Chlamydophila pneumoniae NOT DETECTED NOT DETECTED Final   Mycoplasma pneumoniae NOT DETECTED NOT DETECTED Final    Comment: Performed at [facility], [place]. [address], [place], [place] [address]  Radiology Studies: CT RENAL STONE STUDY  Result Date: 07/19/2021 CLINICAL DATA:  RIGHT-side back and flank pain for 5-6 days, low-grade fever, question kidney stone EXAM: CT ABDOMEN AND PELVIS WITHOUT CONTRAST TECHNIQUE: Multidetector CT imaging of the abdomen and pelvis was performed following the standard protocol without IV contrast. RADIATION DOSE REDUCTION: This exam was performed according to the departmental dose-optimization program which includes automated exposure control, adjustment of the mA and/or kV according to patient size and/or use of iterative reconstruction technique. COMPARISON:  07/04/2021 FINDINGS: Lower chest: RIGHT lower lobe infiltrate consistent with pneumonia. Minimal bibasilar atelectasis. Small BILATERAL pleural effusions larger on RIGHT. Hepatobiliary: Contracted gallbladder.  Liver normal appearance. Pancreas: Normal appearance Spleen: Normal appearance Adrenals/Urinary Tract: Adrenal glands normal appearance. Kidneys normal appearance without mass or hydronephrosis. No urinary tract calcification or ureteral dilatation. Bladder unremarkable. Stomach/Bowel: Normal appendix. Stomach and bowel loops normal appearance. Vascular/Lymphatic: Aorta normal caliber.  No adenopathy. Reproductive: Unremarkable prostate gland and seminal vesicles Other: No free air or free fluid. No inflammatory process. Small umbilical hernia containing fat. Musculoskeletal: Small bone island LEFT1 femoral head. No acute osseous abnormalities.  IMPRESSION: RIGHT lower lobe infiltrate consistent with pneumonia. Small BILATERAL pleural effusions larger on RIGHT and minimal bibasilar atelectasis. Small umbilical hernia containing fat. No acute intra-abdominal or intrapelvic abnormalities. No urinary tract calcification or dilatation. Electronically Signed   By: Lavonia Dana M.D.   On: 07/19/2021 11:02      [place]  If 7PM-7AM, please contact night-coverage [email or web address] 07/19/2021, 11:18 AM

## 2021-07-19 NOTE — TOC Initial Note (Signed)
Transition of Care [facility]) - Initial/Assessment Note    Patient Details  Name: Ronnie Middleton MRN: [id number] Date of Birth: 24-Jun-1980  Transition of [facility]) CM/SW Contact:    Ronnie Cha, RN Phone Number: 07/19/2021, 8:12 AM  Clinical Narrative:                 Toc benefits check for cost and copays of xarelto vs eliquis.  Toc will continue to follow.  Expected Discharge Plan: Home/Self Care Barriers to Discharge: Continued Medical Work up   Patient Goals and CMS Choice Patient states their goals for this hospitalization and ongoing recovery are:: to go back home CMS Medicare.gov Compare Post Acute Care list provided to:: Patient    Expected Discharge Plan and Services Expected Discharge Plan: Home/Self Care   Discharge Planning Services: CM Consult   Living arrangements for the past 2 months: Apartment                                      Prior Living Arrangements/Services Living arrangements for the past 2 months: Apartment Lives with:: Spouse Patient language and need for interpreter reviewed:: Yes Do you feel safe going back to the place where you live?: Yes            Criminal Activity/Legal Involvement Pertinent to Current Situation/Hospitalization: No - Comment as needed  Activities of Daily Living Home Assistive Devices/Equipment: Contact lenses ADL Screening (condition at time of admission) Patient's cognitive ability adequate to safely complete daily activities?: Yes Is the patient deaf or have difficulty hearing?: No Does the patient have difficulty seeing, even when wearing glasses/contacts?: No Does the patient have difficulty concentrating, remembering, or making decisions?: No Patient able to express need for assistance with ADLs?: Yes Does the patient have difficulty dressing or bathing?: No Independently performs ADLs?: Yes (appropriate for developmental age) Does the patient have difficulty walking or climbing stairs?:  No Weakness of Legs: Both Weakness of Arms/Hands: Both  Permission Sought/Granted                  Emotional Assessment Appearance:: Appears stated age     Orientation: : Oriented to Self, Oriented to Place, Oriented to  Time, Oriented to Situation Alcohol / Substance Use: Not Applicable Psych Involvement: No (comment)  Admission diagnosis:  Pulmonary embolism (HCC) [I26.99] PE (pulmonary thromboembolism) ([place]) [I26.99] Pulmonary emboli ([place]) [I26.99] Chest pain, unspecified type [R07.9] Patient Active Problem List   Diagnosis Date Noted   Pulmonary emboli ([place]) 07/18/2021   Pulmonary embolism ([place]) 07/17/2021   Fatigue 07/17/2021   Abnormal LFTs 07/17/2021   PCP:  Ronnie Greenhouse, MD Pharmacy:   [facility] [place] [address] Phone: [phone] Fax: [phone]  [id number] [place]Phone: [phone]: [phone]    Social Determinants of Health (SDOH) Interventions    Readmission Risk Interventions     No data to display

## 2021-07-19 NOTE — Progress Notes (Signed)
Initial Nutrition Assessment  DOCUMENTATION CODES:   Not applicable  INTERVENTION:  - will order Ensure Max once/day, each supplement provides 150 kcal and 30 grams of protein. - will order 1 tablet multivitamin with minerals/day. - RN to weigh patient today.    NUTRITION DIAGNOSIS:   Increased nutrient needs related to acute illness as evidenced by estimated needs.  GOAL:   Patient will meet greater than or equal to 90% of their needs  MONITOR:   PO intake, Supplement acceptance, Labs, Weight trends  REASON FOR ASSESSMENT:   Malnutrition Screening Tool  ASSESSMENT:   41 year old male with medical history of mono in 2019 and COVID in 2022. He presented to the ED due to fatigue and hemoptysis and was found to have sub-segmental PE. He continues to have R back pain which is worse with lying down. CT showed RLL infiltrate consistent with PNA.  Patient sitting up in bed eating lunch of hummus and veggies. It appears that weight on 07/17/21 (214 lb) was copied forward from weight at [facility] on 07/06/21.   Patient sporadically weighs himself at home and is usually in the 215-220 lb range. He shares that when he began to feel unwell he had weighed himself and was 207 lb.   Patient and RN share that patient has been encouraged to get OOB frequently. RN will weigh patient when he is OOB again later today.   Patient shares that over the past few weeks, ~1 month, he has had a decreased appetite without any abdominal pain or nausea associated with intakes. He was still eating as he typically would, but portions were smaller than usual.   His wife was at bedside and patient shares that she has been bringing in some foods for him. Encouraged this.    Labs reviewed; Ca: 8.5 mg/dl, Alk Phos elevated, LFTs elevated.   Medications reviewed; 40 mg oral protonix/day.      NUTRITION - FOCUSED PHYSICAL EXAM:  Flowsheet Row Most Recent Value  Orbital Region No depletion  Upper Arm  Region No depletion  Thoracic and Lumbar Region Unable to assess  Buccal Region No depletion  Temple Region No depletion  Clavicle Bone Region No depletion  Clavicle and Acromion Bone Region No depletion  Scapular Bone Region No depletion  Dorsal Hand No depletion  Patellar Region Unable to assess  Anterior Thigh Region Unable to assess  Posterior Calf Region Unable to assess  Edema (RD Assessment) Unable to assess  Hair Reviewed  Eyes Reviewed  Mouth Reviewed  Skin Reviewed  Nails Reviewed       Diet Order:   Diet Order             Diet general           Diet regular Room service appropriate? Yes; Fluid consistency: Thin  Diet effective now                   EDUCATION NEEDS:   No education needs have been identified at this time  Skin:  Skin Assessment: Reviewed RN Assessment  Last BM:  PTA/unknown  Height:   Ht Readings from Last 1 Encounters:  07/17/21 $RemoveB'6\' 4"'lVtZxHLr$  (1.93 m)    Weight:   Wt Readings from Last 1 Encounters:  07/17/21 97.1 kg     BMI:  Body mass index is 26.06 kg/m.  Estimated Nutritional Needs:  Kcal:  2300-2550 kcal Protein:  115-130 grams Fluid:  >/= 2.8 L/day     Jarome Matin, MS,  RD, LDN, CNSC Registered Dietitian II Inpatient Clinical Nutrition RD pager # and on-call/weekend pager # available in [place]

## 2021-07-19 NOTE — TOC Transition Note (Signed)
Transition of Care [facility]) - CM/SW Discharge Note   Patient Details  Name: Ronnie Middleton MRN: [id number] Date of Birth: 08-Sep-1980  Transition of Care [facility]) CM/SW Contact:  [name], RN Phone Number: 07/19/2021, 9:33 AM   Clinical Narrative:    Patient dcd to home.  Chart checked for toc needs none found.     Barriers to Discharge: Continued Medical Work up   Patient Goals and CMS Choice Patient states their goals for this hospitalization and ongoing recovery are:: to go back home CMS Medicare.gov Compare Post Acute Care list provided to:: Patient    Discharge Placement                       Discharge Plan and Services   Discharge Planning Services: CM Consult                                 Social Determinants of Health (SDOH) Interventions     Readmission Risk Interventions     No data to display

## 2021-07-19 NOTE — TOC Benefit Eligibility Note (Signed)
Patient Advocate Encounter  Prior Authorization for Eliquis 5 mg has been approved.    PA# [id number] Key: [id number] Effective dates: 07/19/2021 through 07/19/2022  Patients co-pay is $90.00.     Lyndel Safe, [place] Patient Advocate Specialist [place] Patient Advocate Team Direct Number: [phone]  Fax: [phone]

## 2021-07-19 NOTE — Progress Notes (Addendum)
[place] for IV heparin>>apixaban Indication: new PE  No Known Allergies  Patient Measurements: Height: '6\' 4"'$  (193 cm) Weight: 97.1 kg (214 lb 1.1 oz) IBW/kg (Calculated) : 86.8 Heparin Dosing Weight: 97.1 kg  Vital Signs: Temp: 97.9 F (36.6 C) (06/21 0300) Temp Source: Oral (06/21 0300) BP: 107/68 (06/21 0300) Pulse Rate: 83 (06/21 0300)  Labs: Recent Labs    07/17/21 1540 07/17/21 2015 07/18/21 0348 07/18/21 1012 07/19/21 0529  HGB 12.6*  --  12.0*  --  12.2*  HCT 37.7*  --  35.8*  --  37.1*  PLT 342  --  323  --  329  LABPROT 13.4  --   --   --   --   INR 1.0  --   --   --   --   HEPARINUNFRC  --   --  0.34 0.41 0.55  CREATININE 0.75  --   --   --  0.81  TROPONINIHS 2 3  --   --   --      Estimated Creatinine Clearance: 151.8 mL/min (by C-G formula based on SCr of 0.81 mg/dL).  Assessment: 65 yoM presenting with 3-4 weeks of malaise, R-sided CP with hemoptysis x 5 days, tachycardia, fever, and elevated D-dimer. CTA shows multiple RLL segmental/subsegmental emboli accompanied by possible PNA or infarct; no RH strain noted. Pharmacy to dose IV heparin.  Baseline INR: 1.0 Prior anticoagulation: none   Today, 07/19/2021: Heparin level = 0.55 (therapeutic) with heparin infusing at 1750 units/hr CBC: Hgb = 12.2 slightly low but stable; Plt WNL No bleeding or infusion issues noted  Goal of Therapy: Heparin level 0.3-0.7 units/ml Monitor platelets by anticoagulation protocol: Yes  Plan: Continue Heparin 1750 units/hr IV infusion Monitor daily heparin level, CBC, signs/symptoms of bleeding F/u transition to [place], Pharm.D 07/19/2021 8:06 AM   Addendum: transition to Eliquis  Plan: DC heparin drip Eliquis 10 mg po bid x 7 days followed by Eliquis 5 mg po bid Rx to be sent to [facility]> called them & ensured they will provide the 30 day free coupon for starter pack prescription Will educate today  prior to [place], Pharm.D 07/19/2021 11:27 AM

## 2021-07-19 NOTE — Discharge Instructions (Addendum)
Information on my medicine - ELIQUIS (apixaban)  This medication education was reviewed with me or my healthcare representative as part of my discharge preparation.  Why was Eliquis prescribed for you? Eliquis was prescribed to treat blood clots that may have been found in the veins of your legs (deep vein thrombosis) or in your lungs (pulmonary embolism) and to reduce the risk of them occurring again.  What do You need to know about Eliquis ? The starting dose is 10 mg (two 5 mg tablets) taken TWICE daily for the FIRST SEVEN (7) DAYS, then on July 26, 2021  the dose is reduced to ONE 5 mg tablet taken TWICE daily.  Eliquis may be taken with or without food.   Try to take the dose about the same time in the morning and in the evening. If you have difficulty swallowing the tablet whole please discuss with your pharmacist how to take the medication safely.  Take Eliquis exactly as prescribed and DO NOT stop taking Eliquis without talking to the doctor who prescribed the medication.  Stopping may increase your risk of developing a new blood clot.  Refill your prescription before you run out.  After discharge, you should have regular check-up appointments with your healthcare provider that is prescribing your Eliquis.    What do you do if you miss a dose? If a dose of ELIQUIS is not taken at the scheduled time, take it as soon as possible on the same day and twice-daily administration should be resumed. The dose should not be doubled to make up for a missed dose.  Important Safety Information A possible side effect of Eliquis is bleeding. You should call your healthcare provider right away if you experience any of the following: Bleeding from an injury or your nose that does not stop. Unusual colored urine (red or dark brown) or unusual colored stools (red or black). Unusual bruising for unknown reasons. A serious fall or if you hit your head (even if there is no bleeding).  Some  medicines may interact with Eliquis and might increase your risk of bleeding or clotting while on Eliquis. To help avoid this, consult your healthcare provider or pharmacist prior to using any new prescription or non-prescription medications, including herbals, vitamins, non-steroidal anti-inflammatory drugs (NSAIDs) and supplements.  This website has more information on Eliquis (apixaban): http://www.eliquis.com/eliquis/home

## 2021-07-19 NOTE — Progress Notes (Signed)
Notified on call provider, Charge Nurse, and Rapid Response Nurse as patient was complaining of discomfort in lower, right side of back and radiating towards right flank. On call provider had put in an one time order for ibuprofen at the beginning of the shift, an one time order for morphine 1 mg IV, and an order for a K pad. Patient had mentioned that the pain/discomfort he was having was almost obsolete when sitting upright, but the pain/discomfort would get worse when he started to lay back. Nurse notified Charge Nurse and Rapid Response Nurse to come look at the patient to assess.

## 2021-07-20 ENCOUNTER — Other Ambulatory Visit (HOSPITAL_COMMUNITY): Payer: Self-pay

## 2021-07-20 DIAGNOSIS — R7989 Other specified abnormal findings of blood chemistry: Secondary | ICD-10-CM | POA: Diagnosis not present

## 2021-07-20 DIAGNOSIS — M546 Pain in thoracic spine: Secondary | ICD-10-CM

## 2021-07-20 DIAGNOSIS — I2699 Other pulmonary embolism without acute cor pulmonale: Secondary | ICD-10-CM | POA: Diagnosis not present

## 2021-07-20 DIAGNOSIS — B2790 Infectious mononucleosis, unspecified without complication: Secondary | ICD-10-CM

## 2021-07-20 DIAGNOSIS — J189 Pneumonia, unspecified organism: Secondary | ICD-10-CM | POA: Diagnosis not present

## 2021-07-20 LAB — CBC
HCT: 36.4 % — ABNORMAL LOW (ref 39.0–52.0)
Hemoglobin: 11.9 g/dL — ABNORMAL LOW (ref 13.0–17.0)
MCH: 29.6 pg (ref 26.0–34.0)
MCHC: 32.7 g/dL (ref 30.0–36.0)
MCV: 90.5 fL (ref 80.0–100.0)
Platelets: 373 10*3/uL (ref 150–400)
RBC: 4.02 MIL/uL — ABNORMAL LOW (ref 4.22–5.81)
RDW: 12.8 % (ref 11.5–15.5)
WBC: 7.2 10*3/uL (ref 4.0–10.5)
nRBC: 0 % (ref 0.0–0.2)

## 2021-07-20 LAB — COMPREHENSIVE METABOLIC PANEL
ALT: 80 U/L — ABNORMAL HIGH (ref 0–44)
AST: 42 U/L — ABNORMAL HIGH (ref 15–41)
Albumin: 3 g/dL — ABNORMAL LOW (ref 3.5–5.0)
Alkaline Phosphatase: 193 U/L — ABNORMAL HIGH (ref 38–126)
Anion gap: 7 (ref 5–15)
BUN: 11 mg/dL (ref 6–20)
CO2: 25 mmol/L (ref 22–32)
Calcium: 8.5 mg/dL — ABNORMAL LOW (ref 8.9–10.3)
Chloride: 107 mmol/L (ref 98–111)
Creatinine, Ser: 0.73 mg/dL (ref 0.61–1.24)
GFR, Estimated: >60 mL/min (ref >60)
Glucose, Bld: 97 mg/dL (ref 70–99)
Potassium: 3.5 mmol/L (ref 3.5–5.1)
Sodium: 139 mmol/L (ref 135–145)
Total Bilirubin: 0.6 mg/dL (ref 0.3–1.2)
Total Protein: 6.6 g/dL (ref 6.5–8.1)

## 2021-07-20 LAB — PROTEIN S, TOTAL: Protein S Ag, Total: 112 % (ref 60–150)

## 2021-07-20 LAB — MAGNESIUM: Magnesium: 2.2 mg/dL (ref 1.7–2.4)

## 2021-07-20 LAB — PROCALCITONIN: Procalcitonin: <0.1 ng/mL

## 2021-07-20 MED ORDER — AMOXICILLIN-POT CLAVULANATE 875-125 MG PO TABS
1.0000 | ORAL_TABLET | Freq: Two times a day (BID) | ORAL | 0 refills | Status: AC
  Filled 2021-07-20: qty 6, 3d supply, fill #0

## 2021-07-20 MED ORDER — APIXABAN 5 MG PO TABS
5.0000 mg | ORAL_TABLET | Freq: Two times a day (BID) | ORAL | 2 refills | Status: DC
  Filled 2021-07-20: qty 60, 30d supply, fill #0

## 2021-07-20 MED ORDER — METHOCARBAMOL 500 MG PO TABS
500.0000 mg | ORAL_TABLET | Freq: Four times a day (QID) | ORAL | 0 refills | Status: AC | PRN
Start: 2021-07-20 — End: 2021-07-24
  Filled 2021-07-20: qty 16, 4d supply, fill #0

## 2021-07-20 MED ORDER — APIXABAN (ELIQUIS) VTE STARTER PACK (10MG AND 5MG)
ORAL_TABLET | ORAL | 0 refills | Status: DC
  Filled 2021-07-20: qty 74, 30d supply, fill #0

## 2021-07-20 MED ORDER — DM-GUAIFENESIN ER 30-600 MG PO TB12: 1.0000 | ORAL_TABLET | Freq: Two times a day (BID) | ORAL | 0 refills | Status: AC

## 2021-07-20 MED ORDER — AZITHROMYCIN 250 MG PO TABS
250.0000 mg | ORAL_TABLET | Freq: Every day | ORAL | 0 refills | Status: DC
Start: 2021-07-21 — End: 2021-07-31
  Filled 2021-07-20: qty 3, 3d supply, fill #0

## 2021-07-20 NOTE — Progress Notes (Signed)
Pt given and explained discharge instructions. IV's removed. Tele removed. Pharmacy provided medications for discharge to pt in the room. All questions answered. Pt dressed in personal clothing and taken to the main entrance via wheelchair.

## 2021-07-24 LAB — FACTOR 5 LEIDEN

## 2021-07-31 ENCOUNTER — Other Ambulatory Visit: Payer: Self-pay

## 2021-07-31 ENCOUNTER — Inpatient Hospital Stay: Payer: 59

## 2021-07-31 ENCOUNTER — Inpatient Hospital Stay: Payer: 59 | Attending: Physician Assistant | Admitting: Physician Assistant

## 2021-07-31 ENCOUNTER — Encounter: Payer: Self-pay | Admitting: Physician Assistant

## 2021-07-31 VITALS — BP 116/71 | HR 74 | Temp 98.1°F | Resp 17 | Ht 76.0 in | Wt 204.4 lb

## 2021-07-31 DIAGNOSIS — Z7901 Long term (current) use of anticoagulants: Secondary | ICD-10-CM | POA: Diagnosis not present

## 2021-07-31 DIAGNOSIS — Z8616 Personal history of COVID-19: Secondary | ICD-10-CM | POA: Diagnosis not present

## 2021-07-31 DIAGNOSIS — I2699 Other pulmonary embolism without acute cor pulmonale: Secondary | ICD-10-CM

## 2021-07-31 DIAGNOSIS — I2693 Single subsegmental pulmonary embolism without acute cor pulmonale: Secondary | ICD-10-CM | POA: Insufficient documentation

## 2021-07-31 LAB — CBC WITH DIFFERENTIAL (CANCER CENTER ONLY)
Abs Immature Granulocytes: 0.01 10*3/uL (ref 0.00–0.07)
Basophils Absolute: 0 10*3/uL (ref 0.0–0.1)
Basophils Relative: 1 %
Eosinophils Absolute: 0.1 10*3/uL (ref 0.0–0.5)
Eosinophils Relative: 1 %
HCT: 41.7 % (ref 39.0–52.0)
Hemoglobin: 14.3 g/dL (ref 13.0–17.0)
Immature Granulocytes: 0 %
Lymphocytes Relative: 48 %
Lymphs Abs: 3 10*3/uL (ref 0.7–4.0)
MCH: 29.8 pg (ref 26.0–34.0)
MCHC: 34.3 g/dL (ref 30.0–36.0)
MCV: 86.9 fL (ref 80.0–100.0)
Monocytes Absolute: 0.5 10*3/uL (ref 0.1–1.0)
Monocytes Relative: 7 %
Neutro Abs: 2.7 10*3/uL (ref 1.7–7.7)
Neutrophils Relative %: 43 %
Platelet Count: 372 10*3/uL (ref 150–400)
RBC: 4.8 MIL/uL (ref 4.22–5.81)
RDW: 13 % (ref 11.5–15.5)
WBC Count: 6.2 10*3/uL (ref 4.0–10.5)
nRBC: 0 % (ref 0.0–0.2)

## 2021-07-31 LAB — CMP (CANCER CENTER ONLY)
ALT: 91 U/L — ABNORMAL HIGH (ref 0–44)
AST: 51 U/L — ABNORMAL HIGH (ref 15–41)
Albumin: 4.3 g/dL (ref 3.5–5.0)
Alkaline Phosphatase: 189 U/L — ABNORMAL HIGH (ref 38–126)
Anion gap: 5 (ref 5–15)
BUN: 17 mg/dL (ref 6–20)
CO2: 31 mmol/L (ref 22–32)
Calcium: 9.7 mg/dL (ref 8.9–10.3)
Chloride: 102 mmol/L (ref 98–111)
Creatinine: 0.82 mg/dL (ref 0.61–1.24)
GFR, Estimated: >60 mL/min (ref >60)
Glucose, Bld: 89 mg/dL (ref 70–99)
Potassium: 3.9 mmol/L (ref 3.5–5.1)
Sodium: 138 mmol/L (ref 135–145)
Total Bilirubin: 0.5 mg/dL (ref 0.3–1.2)
Total Protein: 8 g/dL (ref 6.5–8.1)

## 2021-08-01 LAB — CARDIOLIPIN ANTIBODIES, IGG, IGM, IGA
Anticardiolipin IgA: <9 APL U/mL (ref 0–11)
Anticardiolipin IgG: <9 GPL U/mL (ref 0–14)
Anticardiolipin IgM: 14 MPL U/mL — ABNORMAL HIGH (ref 0–12)

## 2021-08-01 LAB — BETA-2-GLYCOPROTEIN I ABS, IGG/M/A
Beta-2 Glyco I IgG: <9 GPI IgG units (ref 0–20)
Beta-2-Glycoprotein I IgA: <9 GPI IgA units (ref 0–25)
Beta-2-Glycoprotein I IgM: <9 GPI IgM units (ref 0–32)

## 2021-08-02 LAB — PROTEIN C, TOTAL: Protein C, Total: 111 % (ref 60–150)

## 2021-08-02 NOTE — Progress Notes (Signed)
[facility] [phone]) [phone]   Fax:([phone]) [place] NOTE  Patient Care Team: Algis Greenhouse, MD as PCP - General (Family Medicine)  Hematological/Oncological History 1) 07/17/2021-07/20/2021: Admitted for subsegmental pulmonary emboli. He was treated with IV heparin. Doppler US was negative for DVT. Additionally, CT imaging showed RLL infiltrate concerning for pneumonia and/or pneumonitis. Patient was treated on CTX, Azithromycin and robaxin. He was discharged on Augmentin and Azithromycin x 3 days along with eliquis for PE.   2) 07/31/2021: Establish care with [facility]  CHIEF COMPLAINTS/PURPOSE OF CONSULTATION:  "Pulmonary emboli "  HISTORY OF PRESENTING ILLNESS:  Ronnie Middleton 41 y.o. male with medical history significant for chronic EBV infection with prior mononucleosis. He presents to the clinic for evaluation of recently diagnosed pulmonary emboli. He is unaccompanied for this visit.   On exam today, [name] reports that within a few days from discharge, he was feeling much better. He is compliant with taking Eliquis and denies easy bruising or active bleeding. He denies any changes to his appetite or weight loss. He denies nausea, vomiting or abdominal pain. He reports one day of having diarrhea that has resolved. He denies any fevers, chills, sweats, shortness of breath, chest pain or cough. He has no other complaints. Rest of the 10 point ROS is below.   MEDICAL HISTORY:  Past Medical History:  Diagnosis Date   COVID 2022   Mononucleosis    Strep throat     SURGICAL HISTORY: Past Surgical History:  Procedure Laterality Date   WISDOM TOOTH EXTRACTION      SOCIAL HISTORY: Social History   Socioeconomic History   Marital status: Married    Spouse name: Not on file   Number of children: Not on file   Years of education: Not on file   Highest education level: Not on file  Occupational History   Not on file  Tobacco Use    Smoking status: Never    Passive exposure: Never   Smokeless tobacco: Never  Vaping Use   Vaping Use: Never used  Substance and Sexual Activity   Alcohol use: Not Currently   Drug use: Not Currently   Sexual activity: Yes  Other Topics Concern   Not on file  Social History Narrative   Not on file   Social Determinants of Health   Financial Resource Strain: Not on file  Food Insecurity: Not on file  Transportation Needs: Not on file  Physical Activity: Not on file  Stress: Not on file  Social Connections: Not on file  Intimate Partner Violence: Not on file    FAMILY HISTORY: History reviewed. No pertinent family history.  ALLERGIES:  has No Known Allergies.  MEDICATIONS:  Current Outpatient Medications  Medication Sig Dispense Refill   acetaminophen (TYLENOL) 325 MG tablet Take 2 tablets (650 mg total) by mouth every 6 (six) hours as needed for mild pain, headache or fever.     [START ON 08/20/2021] apixaban (ELIQUIS) 5 MG TABS tablet Take 1 tablet (5 mg total) by mouth 2 (two) times daily. Start when you finish starter pack 60 tablet 2   APIXABAN (ELIQUIS) VTE STARTER PACK ('10MG'$  AND '5MG'$ ) Take as directed on package: start with two-'5mg'$  tablets twice daily for 7 days. On day 8, switch to one-'5mg'$  tablet twice daily. 74 each 0   omeprazole (PRILOSEC OTC) 20 MG tablet Take 1 tablet by mouth daily.     No current facility-administered medications for this visit.  REVIEW OF SYSTEMS:   Constitutional: ( - ) fevers, ( - )  chills , ( - ) night sweats Eyes: ( - ) blurriness of vision, ( - ) double vision, ( - ) watery eyes Ears, nose, mouth, throat, and face: ( - ) mucositis, ( - ) sore throat Respiratory: ( - ) cough, ( - ) dyspnea, ( - ) wheezes Cardiovascular: ( - ) palpitation, ( - ) chest discomfort, ( - ) lower extremity swelling Gastrointestinal:  ( - ) nausea, ( - ) heartburn, ( - ) change in bowel habits Skin: ( - ) abnormal skin rashes Lymphatics: ( - ) new  lymphadenopathy, ( - ) easy bruising Neurological: ( - ) numbness, ( - ) tingling, ( - ) new weaknesses Behavioral/Psych: ( - ) mood change, ( - ) new changes  All other systems were reviewed with the patient and are negative.  PHYSICAL EXAMINATION: ECOG PERFORMANCE STATUS: 1 - Symptomatic but completely ambulatory  Vitals:   07/31/21 1056  BP: 116/71  Pulse: 74  Resp: 17  Temp: 98.1 F (36.7 C)  SpO2: 100%   Filed Weights   07/31/21 1056  Weight: 204 lb 6.4 oz (92.7 kg)    GENERAL: well appearing male in NAD  SKIN: skin color, texture, turgor are normal, no rashes or significant lesions EYES: conjunctiva are pink and non-injected, sclera clear OROPHARYNX: no exudate, no erythema; lips, buccal mucosa, and tongue normal  NECK: supple, non-tender LYMPH:  no palpable lymphadenopathy in the cervical, or supraclavicular lymph nodes.  LUNGS: clear to auscultation and percussion with normal breathing effort HEART: regular rate & rhythm and no murmurs and no lower extremity edema ABDOMEN: soft, non-tender, non-distended, normal bowel sounds Musculoskeletal: no cyanosis of digits and no clubbing  PSYCH: alert & oriented x 3, fluent speech NEURO: no focal motor/sensory deficits  LABORATORY DATA:  I have reviewed the data as listed    Latest Ref Rng & Units 07/31/2021   12:33 PM 07/20/2021    5:03 AM 07/19/2021    5:29 AM  CBC  WBC 4.0 - 10.5 K/uL 6.2  7.2  8.1   Hemoglobin 13.0 - 17.0 g/dL 14.3  11.9  12.2   Hematocrit 39.0 - 52.0 % 41.7  36.4  37.1   Platelets 150 - 400 K/uL 372  373  329        Latest Ref Rng & Units 07/31/2021   12:33 PM 07/20/2021    5:03 AM 07/19/2021    5:29 AM  CMP  Glucose 70 - 99 mg/dL 89  97  101   BUN 6 - 20 mg/dL '17  11  10   '$ Creatinine 0.61 - 1.24 mg/dL 0.82  0.73  0.81   Sodium 135 - 145 mmol/L 138  139  141   Potassium 3.5 - 5.1 mmol/L 3.9  3.5  3.6   Chloride 98 - 111 mmol/L 102  107  106   CO2 22 - 32 mmol/L '31  25  28   '$ Calcium 8.9 - 10.3  mg/dL 9.7  8.5  8.5   Total Protein 6.5 - 8.1 g/dL 8.0  6.6  6.9   Total Bilirubin 0.3 - 1.2 mg/dL 0.5  0.6  0.6   Alkaline Phos 38 - 126 U/L 189  193  194   AST 15 - 41 U/L 51  42  59   ALT 0 - 44 U/L 91  80  89     RADIOGRAPHIC STUDIES: I have  personally reviewed the radiological images as listed and agreed with the findings in the report. CT RENAL STONE STUDY  Result Date: 07/19/2021 CLINICAL DATA:  RIGHT-side back and flank pain for 5-6 days, low-grade fever, question kidney stone EXAM: CT ABDOMEN AND PELVIS WITHOUT CONTRAST TECHNIQUE: Multidetector CT imaging of the abdomen and pelvis was performed following the standard protocol without IV contrast. RADIATION DOSE REDUCTION: This exam was performed according to the departmental dose-optimization program which includes automated exposure control, adjustment of the mA and/or kV according to patient size and/or use of iterative reconstruction technique. COMPARISON:  07/04/2021 FINDINGS: Lower chest: RIGHT lower lobe infiltrate consistent with pneumonia. Minimal bibasilar atelectasis. Small BILATERAL pleural effusions larger on RIGHT. Hepatobiliary: Contracted gallbladder.  Liver normal appearance. Pancreas: Normal appearance Spleen: Normal appearance Adrenals/Urinary Tract: Adrenal glands normal appearance. Kidneys normal appearance without mass or hydronephrosis. No urinary tract calcification or ureteral dilatation. Bladder unremarkable. Stomach/Bowel: Normal appendix. Stomach and bowel loops normal appearance. Vascular/Lymphatic: Aorta normal caliber.  No adenopathy. Reproductive: Unremarkable prostate gland and seminal vesicles Other: No free air or free fluid. No inflammatory process. Small umbilical hernia containing fat. Musculoskeletal: Small bone island LEFT1 femoral head. No acute osseous abnormalities. IMPRESSION: RIGHT lower lobe infiltrate consistent with pneumonia. Small BILATERAL pleural effusions larger on RIGHT and minimal bibasilar  atelectasis. Small umbilical hernia containing fat. No acute intra-abdominal or intrapelvic abnormalities. No urinary tract calcification or dilatation. Electronically Signed   By: Lavonia Dana M.D.   On: 07/19/2021 11:02   ECHOCARDIOGRAM COMPLETE  Result Date: 07/18/2021    ECHOCARDIOGRAM REPORT   Patient Name:   Ronnie Middleton Date of Exam: 07/18/2021 Medical Rec #:  [id number]       Height:       76.0 in Accession #:    [id number]      Weight:       214.1 lb Date of Birth:  Dec 06, 1980       BSA:          2.280 m Patient Age:    38 years        BP:           140/66 mmHg Patient Gender: M               HR:           97 bpm. Exam Location:  Inpatient Procedure: 2D Echo, Cardiac Doppler and Color Doppler Indications:    Pulmonary embolism  History:        Patient has no prior history of Echocardiogram examinations.  Sonographer:    Jefferey Pica Referring Phys: [place]  Sonographer Comments: Study was technically difficult. Exam was performed with patient sitting up. IMPRESSIONS  1. Left ventricular ejection fraction, by estimation, is 65 to 70%. The left ventricle has normal function. The left ventricle has no regional wall motion abnormalities. Left ventricular diastolic parameters were normal.  2. Right ventricular systolic function is normal. The right ventricular size is normal. Tricuspid regurgitation signal is inadequate for assessing PA pressure.  3. The mitral valve is normal in structure. No evidence of mitral valve regurgitation. No evidence of mitral stenosis.  4. The aortic valve is tricuspid. Aortic valve regurgitation is not visualized. No aortic stenosis is present.  5. The inferior vena cava is normal in size with greater than 50% respiratory variability, suggesting right atrial pressure of 3 mmHg. Comparison(s): No prior Echocardiogram. FINDINGS  Left Ventricle: Left ventricular ejection fraction, by estimation, is 65 to 70%.  The left ventricle has normal function. The left  ventricle has no regional wall motion abnormalities. The left ventricular internal cavity size was normal in size. There is  no left ventricular hypertrophy. Left ventricular diastolic parameters were normal. Right Ventricle: The right ventricular size is normal. No increase in right ventricular wall thickness. Right ventricular systolic function is normal. Tricuspid regurgitation signal is inadequate for assessing PA pressure. Left Atrium: Left atrial size was normal in size. Right Atrium: Right atrial size was normal in size. Pericardium: There is no evidence of pericardial effusion. Mitral Valve: The mitral valve is normal in structure. No evidence of mitral valve regurgitation. No evidence of mitral valve stenosis. Tricuspid Valve: The tricuspid valve is normal in structure. Tricuspid valve regurgitation is not demonstrated. No evidence of tricuspid stenosis. Aortic Valve: The aortic valve is tricuspid. Aortic valve regurgitation is not visualized. No aortic stenosis is present. Aortic valve peak gradient measures 6.8 mmHg. Pulmonic Valve: The pulmonic valve was not well visualized. Pulmonic valve regurgitation is not visualized. No evidence of pulmonic stenosis. Aorta: The aortic root and ascending aorta are structurally normal, with no evidence of dilitation. Venous: The inferior vena cava is normal in size with greater than 50% respiratory variability, suggesting right atrial pressure of 3 mmHg. IAS/Shunts: No atrial level shunt detected by color flow Doppler.  LEFT VENTRICLE PLAX 2D LVIDd:         4.80 cm   Diastology LVIDs:         3.10 cm   LV e' medial:    11.50 cm/s LV PW:         0.90 cm   LV E/e' medial:  6.7 LV IVS:        1.10 cm   LV e' lateral:   17.30 cm/s LVOT diam:     2.10 cm   LV E/e' lateral: 4.5 LV SV:         65 LV SV Index:   29 LVOT Area:     3.46 cm  RIGHT VENTRICLE             IVC RV S prime:     12.60 cm/s  IVC diam: 2.00 cm TAPSE (M-mode): 2.8 cm LEFT ATRIUM             Index         RIGHT ATRIUM           Index LA diam:        2.90 cm 1.27 cm/m   RA Area:     16.40 cm LA Vol (A2C):   49.8 ml 21.84 ml/m  RA Volume:   42.70 ml  18.73 ml/m LA Vol (A4C):   32.7 ml 14.34 ml/m LA Biplane Vol: 40.7 ml 17.85 ml/m  AORTIC VALVE                 PULMONIC VALVE AV Area (Vmax): 3.36 cm     PV Vmax:       0.94 m/s AV Vmax:        130.00 cm/s  PV Peak grad:  3.5 mmHg AV Peak Grad:   6.8 mmHg LVOT Vmax:      126.00 cm/s LVOT Vmean:     61.100 cm/s LVOT VTI:       0.188 m  AORTA Ao Asc diam: 2.80 cm MITRAL VALVE MV Area (PHT): 4.86 cm    SHUNTS MV Decel Time: 156 msec    Systemic VTI:  0.19 m MV E velocity:  77.10 cm/s  Systemic Diam: 2.10 cm MV A velocity: 82.40 cm/s MV E/A ratio:  0.94 Rudean Haskell MD Electronically signed by Rudean Haskell MD Signature Date/Time: 07/18/2021/10:34:39 AM    Final    VAS [place] LOWER EXTREMITY VENOUS (DVT)  Result Date: 07/18/2021  Lower Venous DVT Study Patient Name:  Ronnie Middleton  Date of Exam:   07/18/2021 Medical Rec #: [id number]        Accession #:    [id number] Date of Birth: November 02, 1980        Patient Gender: M Patient Age:   32 years Exam Location:  [facility] Procedure:      VAS [place] LOWER EXTREMITY VENOUS (DVT) Referring Phys: CHING TU --------------------------------------------------------------------------------  Indications: Pulmonary embolism.  Risk Factors: Confirmed PE. Anticoagulation: Heparin. Comparison Study: No prior studies. Performing Technologist: Oliver Hum RVT  Examination Guidelines: A complete evaluation includes B-mode imaging, spectral Doppler, color Doppler, and power Doppler as needed of all accessible portions of each vessel. Bilateral testing is considered an integral part of a complete examination. Limited examinations for reoccurring indications may be performed as noted. The reflux portion of the exam is performed with the patient in reverse Trendelenburg.   +---------+---------------+---------+-----------+----------+--------------+ RIGHT    CompressibilityPhasicitySpontaneityPropertiesThrombus Aging +---------+---------------+---------+-----------+----------+--------------+ CFV      Full           Yes      Yes                                 +---------+---------------+---------+-----------+----------+--------------+ SFJ      Full                                                        +---------+---------------+---------+-----------+----------+--------------+ FV Prox  Full                                                        +---------+---------------+---------+-----------+----------+--------------+ FV Mid   Full                                                        +---------+---------------+---------+-----------+----------+--------------+ FV DistalFull                                                        +---------+---------------+---------+-----------+----------+--------------+ PFV      Full                                                        +---------+---------------+---------+-----------+----------+--------------+ POP      Full           Yes  Yes                                 +---------+---------------+---------+-----------+----------+--------------+ PTV      Full                                                        +---------+---------------+---------+-----------+----------+--------------+ PERO     Full                                                        +---------+---------------+---------+-----------+----------+--------------+   +---------+---------------+---------+-----------+----------+--------------+ LEFT     CompressibilityPhasicitySpontaneityPropertiesThrombus Aging +---------+---------------+---------+-----------+----------+--------------+ CFV      Full           Yes      Yes                                  +---------+---------------+---------+-----------+----------+--------------+ SFJ      Full                                                        +---------+---------------+---------+-----------+----------+--------------+ FV Prox  Full                                                        +---------+---------------+---------+-----------+----------+--------------+ FV Mid   Full                                                        +---------+---------------+---------+-----------+----------+--------------+ FV DistalFull                                                        +---------+---------------+---------+-----------+----------+--------------+ PFV      Full                                                        +---------+---------------+---------+-----------+----------+--------------+ POP      Full           Yes      Yes                                 +---------+---------------+---------+-----------+----------+--------------+ PTV      Full                                                        +---------+---------------+---------+-----------+----------+--------------+  PERO     Full                                                        +---------+---------------+---------+-----------+----------+--------------+     Summary: RIGHT: - There is no evidence of deep vein thrombosis in the lower extremity.  - No cystic structure found in the popliteal fossa.  LEFT: - There is no evidence of deep vein thrombosis in the lower extremity.  - No cystic structure found in the popliteal fossa.  *See table(s) above for measurements and observations. Electronically signed by Deitra Mayo MD on 07/18/2021 at 10:33:39 AM.    Final    CT Angio Chest PE W/Cm &/Or Wo Cm  Result Date: 07/17/2021 CLINICAL DATA:  Suspected pulmonary embolism, positive D-dimer EXAM: CT ANGIOGRAPHY CHEST WITH CONTRAST TECHNIQUE: Multidetector CT imaging of the chest was performed using  the standard protocol during bolus administration of intravenous contrast. Multiplanar CT image reconstructions and MIPs were obtained to evaluate the vascular anatomy. RADIATION DOSE REDUCTION: This exam was performed according to the departmental dose-optimization program which includes automated exposure control, adjustment of the mA and/or kV according to patient size and/or use of iterative reconstruction technique. CONTRAST:  74m OMNIPAQUE IOHEXOL 350 MG/ML SOLN COMPARISON:  Chest x-ray earlier the same day FINDINGS: Cardiovascular: Multiple pulmonary artery filling defects identified consistent with emboli, largest in the right lower lobe proximal segmental to subsegmental branches. Additional small emboli are mostly subsegmental. No saddle embolism identified. Main pulmonary artery is normal caliber. Heart size is normal. No pericardial effusion. No evidence of right heart strain. Thoracic aorta is normal in course and caliber. Mediastinum/Nodes: No bulky axillary, hilar or mediastinal lymphadenopathy identified. Lungs/Pleura: Irregular ground-glass airspace densities the basilar and dependent aspect of the right lower lobe with adjacent subsegmental atelectatic changes. Small right pleural effusion. No pneumothorax. Upper Abdomen: Tiny hiatal hernia.  No acute process identified. Musculoskeletal: No chest wall abnormality. No acute or significant osseous findings. Review of the MIP images confirms the above findings. IMPRESSION: 1. Positive for pulmonary emboli, most prominent in the right lower lobe as described. No saddle embolism or evidence of right heart strain. 2. Airspace densities at the base of the right lower lobe which may represent atelectasis and infiltrate. 3. Small right pleural effusion. Findings discussed with Dr. HDina Richover the telephone at 7:31 p.m. on 07/17/2021 with read back. Electronically Signed   By: DOfilia NeasM.D.   On: 07/17/2021 19:32   [place]domen Limited RUQ  (LIVER/GB)  Result Date: 07/17/2021 CLINICAL DATA:  Elevated LFTs. EXAM: ULTRASOUND ABDOMEN LIMITED RIGHT UPPER QUADRANT COMPARISON:  CT of the abdomen and pelvis 07/04/2021 FINDINGS: Gallbladder: The patient admits to eating 2-3 hours prior to the exam. The gallbladder is somewhat contracted. Wall thickness is within normal limits. No sonographic MPercell Millersign is present. No stones. Common bile duct: Diameter: 0.6 mm, within normal limits Liver: No focal lesion identified. Within normal limits in parenchymal echogenicity. Portal vein is patent on color Doppler imaging with normal direction of blood flow towards the liver. Other: None. IMPRESSION: Negative right upper quadrant ultrasound. Electronically Signed   By: CSan MorelleM.D.   On: 07/17/2021 16:54   DG Chest 2 View  Result Date: 07/17/2021 CLINICAL DATA:  Hemoptysis. EXAM: CHEST - 2 VIEW COMPARISON:  Chest radiograph dated July 04, 2021 FINDINGS: The heart size and mediastinal contours are within normal limits. Bibasilar atelectasis. No focal consolidation or large pleural effusion. The visualized skeletal structures are unremarkable. IMPRESSION: Bibasilar atelectasis, no evidence of pneumonia or large pleural effusion. Electronically Signed   By: Keane Police D.O.   On: 07/17/2021 13:17   CT ABDOMEN PELVIS W CONTRAST  Result Date: 07/04/2021 CLINICAL DATA:  Abdominal pain. EXAM: CT ABDOMEN AND PELVIS WITH CONTRAST TECHNIQUE: Multidetector CT imaging of the abdomen and pelvis was performed using the standard protocol following bolus administration of intravenous contrast. RADIATION DOSE REDUCTION: This exam was performed according to the departmental dose-optimization program which includes automated exposure control, adjustment of the mA and/or kV according to patient size and/or use of iterative reconstruction technique. CONTRAST:  131m OMNIPAQUE IOHEXOL 300 MG/ML  SOLN COMPARISON:  None Available. FINDINGS: Lower chest: No acute  abnormality.  Small hiatal hernia. Hepatobiliary: No focal liver abnormality is seen. No gallstones, gallbladder wall thickening, or biliary dilatation. Pancreas: Unremarkable. No pancreatic ductal dilatation or surrounding inflammatory changes. Spleen: Normal in size without focal abnormality. Adrenals/Urinary Tract: Adrenal glands are unremarkable. Kidneys are normal, without renal calculi, focal lesion, or hydronephrosis. Bladder is unremarkable. Stomach/Bowel: Stomach is within normal limits. Appendix appears normal. No evidence of bowel wall thickening, distention, or inflammatory changes. Vascular/Lymphatic: No significant vascular findings are present. No enlarged abdominal or pelvic lymph nodes. Shotty porta hepatic is lymph nodes, sub pathologic by CT criteria. Reproductive: Prostate is unremarkable. Other: No abdominal wall hernia or abnormality. No abdominopelvic ascites. Musculoskeletal: No acute or significant osseous findings. IMPRESSION: 1. No acute abnormalities within the abdomen or pelvis. 2. Small hiatal hernia. Electronically Signed   By: DFidela SalisburyM.D.   On: 07/04/2021 14:27   DG Chest 2 View  Result Date: 07/04/2021 CLINICAL DATA:  Fever EXAM: CHEST - 2 VIEW COMPARISON:  07/05/2009 FINDINGS: The heart size and mediastinal contours are within normal limits. Both lungs are clear. The visualized skeletal structures are unremarkable. IMPRESSION: No active cardiopulmonary disease. Electronically Signed   By: NDavina PokeD.O.   On: 07/04/2021 12:27    ASSESSMENT & PLAN Ronnie Middleton a 41y.o. male who presents to the hematology clinic for evaluation of recently diagnosed pulmonary embolism. Reviewed in detail if provoking risk factors including prolonged travel/immobility, surgery (particular abdominal or orthropedic), trauma,  and pregnancy/ estrogen containing birth control. After a detailed history and review of the records there is no clear provoking factor for this  patient's VTE.  Patients with unprovoked VTEs have up to 25% recurrence after 5 years and 36% at 10 years, with 4% of these clots being fatal ([phone]. Therefore the formal recommendation for unprovoked VTE's is lifelong anticoagulation, as the cause may not be transient or reversible. We recommend 6 months or full strength anticoagulation with a re-evaluation after that time.  The patient's will then have a choice of maintenance dose DOAC (preferred, recommended), '81mg'$  ASA PO daily (non-preferred), or no further anticoagulation (not recommended).   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  --recommend the patient continue eliquis '5mg'$  BID --patient denies any bleeding, bruising, or dark stools on this medication. It is well tolerated. No difficulties accessing/affording the medication --RTC in 6 months' time with strict return precautions for overt signs of bleeding.    Orders Placed This Encounter  Procedures   CBC with Differential (  [facility] Only)    Standing Status:   Future    Number of Occurrences:   1    Standing Expiration Date:   07/31/2022   CMP ([facility] only)    Standing Status:   Future    Number of Occurrences:   1    Standing Expiration Date:   07/31/2022   Protein C activity   Protein C, total   Protein S activity   Lupus anticoagulant panel   Beta-2-glycoprotein i abs, IgG/M/A   Prothrombin gene mutation   Cardiolipin antibodies, IgG, IgM, IgA    All questions were answered. The patient knows to call the clinic with any problems, questions or concerns.  I have spent a total of 60 minutes minutes of face-to-face and non-face-to-face time, preparing to see the patient, obtaining and/or reviewing separately obtained history, performing a medically appropriate examination, counseling and educating the patient,  ordering tests/procedures, documenting clinical information in the electronic health record,  and care coordination.   Dede Query, PA-C Department of Hematology/Oncology [place] at [facility] Phone: [phone]

## 2021-08-03 LAB — PTT-LA MIX: PTT-LA Mix: 44 s — ABNORMAL HIGH (ref 0.0–40.5)

## 2021-08-03 LAB — LUPUS ANTICOAGULANT PANEL
DRVVT: 83.2 s — ABNORMAL HIGH (ref 0.0–47.0)
PTT Lupus Anticoagulant: 47.1 s — ABNORMAL HIGH (ref 0.0–43.5)

## 2021-08-03 LAB — PROTEIN S ACTIVITY: Protein S Activity: 130 % (ref 63–140)

## 2021-08-03 LAB — PROTEIN C ACTIVITY: Protein C Activity: 122 % (ref 73–180)

## 2021-08-03 LAB — DRVVT CONFIRM: dRVVT Confirm: 1.3 ratio — ABNORMAL HIGH (ref 0.8–1.2)

## 2021-08-03 LAB — HEXAGONAL PHASE PHOSPHOLIPID: Hexagonal Phase Phospholipid: 7 s (ref 0–11)

## 2021-08-03 LAB — DRVVT MIX: dRVVT Mix: 56.3 s — ABNORMAL HIGH (ref 0.0–40.4)

## 2021-08-04 ENCOUNTER — Telehealth (HOSPITAL_COMMUNITY): Payer: Self-pay

## 2021-08-04 ENCOUNTER — Other Ambulatory Visit (HOSPITAL_COMMUNITY): Payer: Self-pay

## 2021-08-04 NOTE — Telephone Encounter (Signed)
Pharmacy Transitions of Care Follow-up Telephone Call  Date of discharge: 07/20/21  Discharge Diagnosis: PE  How have you been since you were released from the hospital? Patient states he has been doing better since he was discharged. States he hasn't missed any doses, but sometimes has trouble taking it at the same time. We discussed signs and symptoms of bleeding and when to visit the hospital.    Medication changes made at discharge: START taking these medications  START taking these medications  acetaminophen 325 MG tablet Commonly known as: TYLENOL Take 2 tablets (650 mg total) by mouth every 6 (six) hours as needed for mild pain, headache or fever.  * Eliquis DVT/PE Starter Pack Generic drug: Apixaban Starter Pack ('10mg'$  and '5mg'$ ) Take as directed on package: start with two-'5mg'$  tablets twice daily for 7 days. On day 8, switch to one-'5mg'$  tablet twice daily.  * apixaban 5 MG Tabs tablet Commonly known as: ELIQUIS Take 1 tablet (5 mg total) by mouth 2 (two) times daily. Start when you finish starter pack Start taking on: August 20, 2021   very important  * This list has 2 medication(s) that are the same as other medications prescribed for you. Read the directions carefully, and ask your doctor or other care provider to review them with you.   CONTINUE taking these medications  CONTINUE taking these medications  PriLOSEC OTC 20 MG tablet Generic drug: omeprazole   STOP taking these medications  STOP taking these medications  ibuprofen 600 MG tablet Commonly known as: ADVIL    Medication changes verified by the patient? Yes     Medication Accessibility:  [facility]  Was the patient provided with refills on discharged medications? yes   Have all prescriptions been transferred from [facility] to home pharmacy? yes     Medication Review: APIXABAN (ELIQUIS)  Apixaban 10 mg BID initiated on 6/22. Will switch to apixaban 5 mg BID after 7 days (DATE 6/29).  - Discussed  importance of taking medication around the same time everyday  - Reviewed potential DDIs with patient  - Advised patient of medications to avoid (NSAIDs, ASA)  - Educated that Tylenol (acetaminophen) will be the preferred analgesic to prevent risk of bleeding  - Emphasized importance of monitoring for signs and symptoms of bleeding (abnormal bruising, prolonged bleeding, nose bleeds, bleeding from gums, discolored urine, black tarry stools)  - Advised patient to alert all providers of anticoagulation therapy prior to starting a new medication or having a procedure   Follow-up Appointments:   08/17/2021  8:50 AM NEW PATIENT 20 20 min [facility] [[id number]]    Final Patient Assessment: Patient states he has been doing better since he was discharged. States he hasn't missed any doses, but sometimes has trouble taking it at the same time. We discussed signs and symptoms of bleeding and when to visit the hospital.    Lind Guest, [place] at [facility] 08/04/2021 3:17 PM

## 2021-08-07 LAB — PROTHROMBIN GENE MUTATION

## 2021-08-08 ENCOUNTER — Telehealth: Payer: Self-pay | Admitting: Physician Assistant

## 2021-08-08 NOTE — Telephone Encounter (Signed)
Spoke to Mr. Ronnie Middleton today to review the lab results from 07/31/2021.  Findings show no evidence of a clotting disorder.  Recommendation is definite anticoagulation for unprovoked PE.  Patient will continue on Eliquis 5 mg twice daily.  He will return to the clinic in approximately 6 months to see Dr. Lorenso Courier for a follow-up visit and to determine if he is eligible for the maintenance dose.  Patient expressed understanding and satisfaction with the plan provided.

## 2021-08-17 ENCOUNTER — Ambulatory Visit (INDEPENDENT_AMBULATORY_CARE_PROVIDER_SITE_OTHER): Payer: 59 | Admitting: Gastroenterology

## 2021-08-17 ENCOUNTER — Encounter: Payer: Self-pay | Admitting: Gastroenterology

## 2021-08-17 ENCOUNTER — Other Ambulatory Visit (INDEPENDENT_AMBULATORY_CARE_PROVIDER_SITE_OTHER): Payer: 59

## 2021-08-17 VITALS — BP 120/78 | HR 91 | Ht 76.0 in | Wt 203.5 lb

## 2021-08-17 DIAGNOSIS — K219 Gastro-esophageal reflux disease without esophagitis: Secondary | ICD-10-CM

## 2021-08-17 DIAGNOSIS — R7989 Other specified abnormal findings of blood chemistry: Secondary | ICD-10-CM

## 2021-08-17 LAB — GAMMA GT: GGT: 94 U/L — ABNORMAL HIGH (ref 7–51)

## 2021-08-17 NOTE — Patient Instructions (Addendum)
If you are age 39 or older, your body mass index should be between 23-30. Your Body mass index is 24.77 kg/m. If this is out of the aforementioned range listed, please consider follow up with your Primary Care Provider.  If you are age 5 or younger, your body mass index should be between 19-25. Your Body mass index is 24.77 kg/m. If this is out of the aformentioned range listed, please consider follow up with your Primary Care Provider.   ________________________________________________________  The [facility] providers would like to encourage you to use [facility] to communicate with providers for non-urgent requests or questions.  Due to long hold times on the telephone, sending your provider a message by [facility] may be a faster and more efficient way to get a response.  Please allow 48 business hours for a response.  Please remember that this is for non-urgent requests.  _______________________________________________________  Your provider has requested that you go to the basement level for lab work before leaving today. Press "B" on the elevator. The lab is located at the first door on the left as you exit the elevator.  Try to lose weight gradually  You will be contacted by [facility] in the next 2 days to arrange a Ultrasound Elastography.  The number on your caller ID will be [phone], please answer when they call.  If you have not heard from them in 2 days please call [phone] to schedule.     You have been scheduled for an abdominal ultrasound at [facility] (1st floor of hospital) on       at         . Please arrive 15 minutes prior to your appointment for registration. Make certain not to have anything to eat or drink 6 hours prior to your appointment. Should you need to reschedule your appointment, please contact radiology at [phone]. This test typically takes about 30 minutes to perform.  Thank you,  Dr. Jackquline Denmark

## 2021-08-17 NOTE — Progress Notes (Signed)
Chief Complaint: abn LFTs  Referring Provider:  Tonye Pearson, PA-C      ASSESSMENT AND PLAN;   #1. Abn LFTs- likely d/t Fatty liver. Neg [place], CT and WU as below.  #2. GERD (well controlled on omeprazole 49m po QD)  Plan: -USE -Check AMA, ASMA and ANA, ceruloplasmin, A1AT, celiac screen and GGT. -Check anti-HAV total Ab and HBsAb.  If neg, would recommend vaccination for hep A/B -Wt loss (10lb in 12 weeks) -If continued abn LFTs and above WU is neg would consider liver bx.  At the present time, would like to hold off as he is on Eliquis and given risks.  D/w pt in detail. -Would require EGD to rule out Barrett's in future.  Screening colonoscopy at age [age]as per current recommendations. -FU in 12-16 weeks.    HPI:    Ronnie SMETHERSis a 41y.o. male  With PE on Eliquis 07/17/2021 (neg [place] dVT), chronic EBV with prior mononucleosis 2019, HLD  Ref by Dr DGarlon Hatchetfor abn LFTs.  Dating back to 2015.  No H/O itching, skin lesions, easy bruisability, intake of OTC meds including diet pills, herbal medications, anabolic steroids or Tylenol. There is no H/O blood transfusions, IVDA or FH of liver disease. No jaundice, dark urine or pale stools. No alcohol abuse.  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  From GI standpoint he does have reflux x 7 to 8 years and has been on omeprazole 20 mg p.o. once a day.  As long as he takes omeprazole, he does not have any problems. Aphasia or dysphagia.  He denies having any diarrhea or constipation.   Previous GI/liver work-up: -neg acute hepatitis panel 06/2021 -EBV VCA IgM>160 07/17/2021 -Normal iron studies 07/2021 -[place]6/19/2023: neg -2DE: Nl EF 60 to 70%. Nl R systolic function -CT AP with contrast 07/04/2021: small HH, normal liver.  Normal gallbladder and pancreas.    Wt Readings from Last 3 Encounters:  08/17/21 203 lb 8 oz (92.3 kg)  07/31/21 204 lb  6.4 oz (92.7 kg)  07/19/21 207 lb 14.4 oz (94.3 kg)      Latest Ref Rng & Units 07/31/2021   12:33 PM 07/20/2021    5:03 AM 07/19/2021    5:29 AM  Hepatic Function  Total Protein 6.5 - 8.1 g/dL 8.0  6.6  6.9   Albumin 3.5 - 5.0 g/dL 4.3  3.0  3.2   AST 15 - 41 U/L 51  42  59   ALT 0 - 44 U/L 91  80  89   Alk Phosphatase 38 - 126 U/L 189  193  194   Total Bilirubin 0.3 - 1.2 mg/dL 0.5  0.6  0.6   Bilirubin, Direct 0.0 - 0.2 mg/dL   0.2      SH 1 yrs old daughter, Mom PTayte Mcwherter(pt of ours).  Married [place] was in [place] Past Medical History:  Diagnosis Date   COVID 2022   Mononucleosis    Strep throat     Past Surgical History:  Procedure Laterality Date   WISDOM TOOTH EXTRACTION      Family History  Problem Relation Age of Onset   Anxiety disorder Mother    Hyperlipidemia Father    Prostate cancer Maternal Grandfather    Colon cancer Neg Hx    Rectal cancer Neg Hx  Stomach cancer Neg Hx    Esophageal cancer Neg Hx     Social History   Tobacco Use   Smoking status: Never    Passive exposure: Never   Smokeless tobacco: Never  Vaping Use   Vaping Use: Never used  Substance Use Topics   Alcohol use: Not Currently   Drug use: Not Currently    Current Outpatient Medications  Medication Sig Dispense Refill   acetaminophen (TYLENOL) 325 MG tablet Take 2 tablets (650 mg total) by mouth every 6 (six) hours as needed for mild pain, headache or fever.     [START ON 08/20/2021] apixaban (ELIQUIS) 5 MG TABS tablet Take 1 tablet (5 mg total) by mouth 2 (two) times daily. Start when you finish starter pack 60 tablet 2   omeprazole (PRILOSEC OTC) 20 MG tablet Take 1 tablet by mouth daily.     No current facility-administered medications for this visit.    No Known Allergies  Review of Systems:  Constitutional: Denies fever, chills, diaphoresis, appetite change and fatigue.  HEENT: Denies photophobia, eye pain, redness, hearing loss, ear pain, congestion, sore  throat, rhinorrhea, sneezing, mouth sores, neck pain, neck stiffness and tinnitus.   Respiratory: Denies SOB, DOE, cough, chest tightness,  and wheezing.   Cardiovascular: Denies chest pain, palpitations and leg swelling.  Genitourinary: Denies dysuria, urgency, frequency, hematuria, flank pain and difficulty urinating.  Musculoskeletal: Denies myalgias, back pain, joint swelling, arthralgias and gait problem.  Skin: No rash.  Neurological: Denies dizziness, seizures, syncope, weakness, light-headedness, numbness and headaches.  Hematological: Denies adenopathy. Easy bruising, personal or family bleeding history  Psychiatric/Behavioral: No anxiety or depression     Physical Exam:    BP 120/78   Pulse 91   Ht _0  (1.93 m)   Wt 203 lb 8 oz (92.3 kg)   SpO2 98%   BMI 24.77 kg/m  Wt Readings from Last 3 Encounters:  08/17/21 203 lb 8 oz (92.3 kg)  07/31/21 204 lb 6.4 oz (92.7 kg)  07/19/21 207 lb 14.4 oz (94.3 kg)   Constitutional:  Well-developed, in no acute distress. Psychiatric: Normal mood and affect. Behavior is normal. HEENT: Pupils normal.  Conjunctivae are normal. No scleral icterus. Neck supple.  Cardiovascular: Normal rate, regular rhythm. No edema Pulmonary/chest: Effort normal and breath sounds normal. No wheezing, rales or rhonchi. Abdominal: Soft, nondistended. Nontender. Bowel sounds active throughout. There are no masses palpable. No hepatomegaly.  Liver is palpated 2 cm below the costal margin.  Nontender. Rectal: Deferred Neurological: Alert and oriented to person place and time. Skin: Skin is warm and dry. No rashes noted.  Data Reviewed: I have personally reviewed following labs and imaging studies  CBC:    Latest Ref Rng & Units 07/31/2021   12:33 PM 07/20/2021    5:03 AM 07/19/2021    5:29 AM  CBC  WBC 4.0 - 10.5 K/uL 6.2  7.2  8.1   Hemoglobin 13.0 - 17.0 g/dL 14.3  11.9  12.2   Hematocrit 39.0 - 52.0 % 41.7  36.4  37.1   Platelets 150 - 400 K/uL 372   373  329     CMP:    Latest Ref Rng & Units 07/31/2021   12:33 PM 07/20/2021    5:03 AM 07/19/2021    5:29 AM  CMP  Glucose 70 - 99 mg/dL 89  97  101   BUN 6 - 20 mg/dL _1 Creatinine 0.61 - 1.24 mg/dL  0.82  0.73  0.81   Sodium 135 - 145 mmol/L 138  139  141   Potassium 3.5 - 5.1 mmol/L 3.9  3.5  3.6   Chloride 98 - 111 mmol/L 102  107  106   CO2 22 - 32 mmol/L _0 Calcium 8.9 - 10.3 mg/dL 9.7  8.5  8.5   Total Protein 6.5 - 8.1 g/dL 8.0  6.6  6.9   Total Bilirubin 0.3 - 1.2 mg/dL 0.5  0.6  0.6   Alkaline Phos 38 - 126 U/L 189  193  194   AST 15 - 41 U/L 51  42  59   ALT 0 - 44 U/L 91  80  89     GFR: Estimated Creatinine Clearance: 150 mL/min (by C-G formula based on SCr of 0.82 mg/dL). Liver Function Tests: No results for input(s): "AST", "ALT", "ALKPHOS", "BILITOT", "PROT", "ALBUMIN" in the last 168 hours. No results for input(s): "LIPASE", "AMYLASE" in the last 168 hours. No results for input(s): "AMMONIA" in the last 168 hours. Coagulation Profile: No results for input(s): "INR", "PROTIME" in the last 168 hours. HbA1C: No results for input(s): "HGBA1C" in the last 72 hours. Lipid Profile: No results for input(s): "CHOL", "HDL", "LDLCALC", "TRIG", "CHOLHDL", "LDLDIRECT" in the last 72 hours. Thyroid Function Tests: No results for input(s): "TSH", "T4TOTAL", "FREET4", "T3FREE", "THYROIDAB" in the last 72 hours. Anemia Panel: No results for input(s): "VITAMINB12", "FOLATE", "FERRITIN", "TIBC", "IRON", "RETICCTPCT" in the last 72 hours.  No results found for this or any previous visit (from the past 240 hour(s)).    Radiology Studies: CT RENAL STONE STUDY  Result Date: 07/19/2021 CLINICAL DATA:  RIGHT-side back and flank pain for 5-6 days, low-grade fever, question kidney stone EXAM: CT ABDOMEN AND PELVIS WITHOUT CONTRAST TECHNIQUE: Multidetector CT imaging of the abdomen and pelvis was performed following the standard protocol without IV contrast.  RADIATION DOSE REDUCTION: This exam was performed according to the departmental dose-optimization program which includes automated exposure control, adjustment of the mA and/or kV according to patient size and/or use of iterative reconstruction technique. COMPARISON:  07/04/2021 FINDINGS: Lower chest: RIGHT lower lobe infiltrate consistent with pneumonia. Minimal bibasilar atelectasis. Small BILATERAL pleural effusions larger on RIGHT. Hepatobiliary: Contracted gallbladder.  Liver normal appearance. Pancreas: Normal appearance Spleen: Normal appearance Adrenals/Urinary Tract: Adrenal glands normal appearance. Kidneys normal appearance without mass or hydronephrosis. No urinary tract calcification or ureteral dilatation. Bladder unremarkable. Stomach/Bowel: Normal appendix. Stomach and bowel loops normal appearance. Vascular/Lymphatic: Aorta normal caliber.  No adenopathy. Reproductive: Unremarkable prostate gland and seminal vesicles Other: No free air or free fluid. No inflammatory process. Small umbilical hernia containing fat. Musculoskeletal: Small bone island LEFT1 femoral head. No acute osseous abnormalities. IMPRESSION: RIGHT lower lobe infiltrate consistent with pneumonia. Small BILATERAL pleural effusions larger on RIGHT and minimal bibasilar atelectasis. Small umbilical hernia containing fat. No acute intra-abdominal or intrapelvic abnormalities. No urinary tract calcification or dilatation. Electronically Signed   By: Lavonia Dana M.D.   On: 07/19/2021 11:02      Carmell Austria, MD 08/17/2021, 9:14 AM  Cc: Tonye Pearson, PA-C

## 2021-08-21 ENCOUNTER — Other Ambulatory Visit: Payer: Self-pay

## 2021-08-21 ENCOUNTER — Encounter: Payer: Self-pay | Admitting: Internal Medicine

## 2021-08-21 ENCOUNTER — Other Ambulatory Visit (HOSPITAL_COMMUNITY): Payer: Self-pay

## 2021-08-21 ENCOUNTER — Ambulatory Visit (INDEPENDENT_AMBULATORY_CARE_PROVIDER_SITE_OTHER): Payer: 59 | Admitting: Internal Medicine

## 2021-08-21 VITALS — BP 112/78 | HR 95 | Temp 98.3°F | Wt 208.0 lb

## 2021-08-21 DIAGNOSIS — R7989 Other specified abnormal findings of blood chemistry: Secondary | ICD-10-CM

## 2021-08-21 DIAGNOSIS — B2790 Infectious mononucleosis, unspecified without complication: Secondary | ICD-10-CM

## 2021-08-21 LAB — CELIAC PANEL 10
Antigliadin Abs, IgA: 3 units (ref 0–19)
Endomysial IgA: NEGATIVE
Gliadin IgG: 1 units (ref 0–19)
IgA/Immunoglobulin A, Serum: 293 mg/dL (ref 90–386)
Tissue Transglut Ab: 3 U/mL (ref 0–5)
Transglutaminase IgA: <2 U/mL (ref 0–3)

## 2021-08-21 NOTE — Progress Notes (Unsigned)
Patient: Ronnie Middleton  DOB: 1980/02/16 MRN: [id number] PCP: Algis Greenhouse, MD  Referring Provider: ***  No chief complaint on file.    Patient Active Problem List   Diagnosis Date Noted   Chronic EBV infection 07/20/2021   CAP (community acquired pneumonia) 07/19/2021   Right-sided back pain 07/19/2021   Acute pulmonary embolism ([place]) 07/17/2021   Fatigue 07/17/2021   Elevated liver function tests 07/06/2021   Mixed hyperlipidemia 12/18/2017     Subjective:  41 year old male with history of mono in 5732 complicated by chronic elevated LFTs, COVID-19 2022 presents with concern for chronic EBV infection.  He was hospitalized at [facility] from 6/19 - 6/22 after presented with hemoptysis and fatigue.  He was found to have subsegmental PE and placed on Eliquis on discharge.  Also CT renal study showed right lower lobe infiltrate concerning for pneumonia and or pneumonitis no spinal megaly or hepatomegaly noted.  He was treated with ceftriaxone, azithromycin inpatient, recurrent pneumonia and discharged on Augmentin and azithromycin placed 5 days of antibiotics.  His EBV IgM VCA > 160returned positive during hospitalization felt to be likely reactivation.  Also noted to have elevated LFTs thought to be in the setting of EBV IgM positive just a reactive activation.  Given GI follow-up.  Seen by PCP and referred to infectious disease. Seen by GI with Dr. Carmell Austria on 7/20.  Noted that acute found hepatitis panel on 6/23, iron studies, ultrasound, 2D echo, CT abdomen, 06/2021 were all negative.  EBV VCA IgM greater than 160 on 6/19 test 2023.  Ordered work-up for elevated LFTs, anti-HCV, hep B antibody, if LFT work-up was negative for concerning liver biopsy.  Plan EGD to rule out Barrett's in the future.  Follow-up in 3 to 4 months. Today pt reports he lived in [place] for 8 years. In march 2019 in [place] he was hospitalized for kidney and bladder infection. He was initially treated for strep  throat at a facility for strep throat with IV abx after fatigue for about a month. His renal function worsened and found to have a UTI. His work up also revealed he had infectious mono and antivirals were added. Reports LFTs have been elevated since. This time reports fatigue since may, initially presented to ED on 6/6 with low grade fever. Then returned as he had hemoptysis. He had no rash, sorethroat, fever and chills.  ROS  Past Medical History:  Diagnosis Date   COVID 2022   Mononucleosis    Strep throat     Outpatient Medications Prior to Visit  Medication Sig Dispense Refill   acetaminophen (TYLENOL) 325 MG tablet Take 2 tablets (650 mg total) by mouth every 6 (six) hours as needed for mild pain, headache or fever.     apixaban (ELIQUIS) 5 MG TABS tablet Take 1 tablet (5 mg total) by mouth 2 (two) times daily. Start when you finish starter pack 60 tablet 2   omeprazole (PRILOSEC OTC) 20 MG tablet Take 1 tablet by mouth daily.     No facility-administered medications prior to visit.     No Known Allergies  Social History   Tobacco Use   Smoking status: Never    Passive exposure: Never   Smokeless tobacco: Never  Vaping Use   Vaping Use: Never used  Substance Use Topics   Alcohol use: Not Currently   Drug use: Not Currently    Family History  Problem Relation Age of Onset   Anxiety disorder  Mother    Hyperlipidemia Father    Prostate cancer Maternal Grandfather    Colon cancer Neg Hx    Rectal cancer Neg Hx    Stomach cancer Neg Hx    Esophageal cancer Neg Hx     Objective:  There were no vitals filed for this visit. There is no height or weight on file to calculate BMI.  Physical Exam  Lab Results: Lab Results  Component Value Date   WBC 6.2 07/31/2021   HGB 14.3 07/31/2021   HCT 41.7 07/31/2021   MCV 86.9 07/31/2021   PLT 372 07/31/2021    Lab Results  Component Value Date   CREATININE 0.82 07/31/2021   BUN 17 07/31/2021   NA 138 07/31/2021   K  3.9 07/31/2021   CL 102 07/31/2021   CO2 31 07/31/2021    Lab Results  Component Value Date   ALT 91 (H) 07/31/2021   AST 51 (H) 07/31/2021   ALKPHOS 189 (H) 07/31/2021   BILITOT 0.5 07/31/2021     Assessment & Plan:  #EBV IgM + 6/21 -PT INR nl -CMV,  VZV, HSV , EBV -Follow-up in one month -He has a referral to immunology Laurice Record, [facility]   08/21/21  1:56 PM

## 2021-08-22 LAB — MITOCHONDRIAL ANTIBODIES: Mitochondrial M2 Ab, IgG: <=20 U (ref <=20.0)

## 2021-08-22 LAB — ANTI-NUCLEAR AB-TITER (ANA TITER)
ANA TITER: 1:40 {titer} — ABNORMAL HIGH
ANA Titer 1: 1:40 {titer} — ABNORMAL HIGH

## 2021-08-22 LAB — ALPHA-1-ANTITRYPSIN: A-1 Antitrypsin, Ser: 129 mg/dL (ref 83–199)

## 2021-08-22 LAB — HEPATITIS A ANTIBODY, TOTAL: Hepatitis A AB,Total: NONREACTIVE

## 2021-08-22 LAB — ANTI-SMOOTH MUSCLE ANTIBODY, IGG: Actin (Smooth Muscle) Antibody (IGG): 29 U — ABNORMAL HIGH (ref <20)

## 2021-08-22 LAB — CERULOPLASMIN: Ceruloplasmin: 33 mg/dL (ref 18–36)

## 2021-08-22 LAB — ANA: Anti Nuclear Antibody (ANA): POSITIVE — AB

## 2021-08-22 LAB — HEPATITIS B SURFACE ANTIBODY,QUALITATIVE: Hep B S Ab: REACTIVE — AB

## 2021-08-24 LAB — CBC WITH DIFFERENTIAL/PLATELET
Absolute Monocytes: 482 cells/uL (ref 200–950)
Basophils Absolute: 28 cells/uL (ref 0–200)
Basophils Relative: 0.5 %
Eosinophils Absolute: 39 cells/uL (ref 15–500)
Eosinophils Relative: 0.7 %
HCT: 43.6 % (ref 38.5–50.0)
Hemoglobin: 14.9 g/dL (ref 13.2–17.1)
Lymphs Abs: 2229 cells/uL (ref 850–3900)
MCH: 30.4 pg (ref 27.0–33.0)
MCHC: 34.2 g/dL (ref 32.0–36.0)
MCV: 89 fL (ref 80.0–100.0)
MPV: 10.2 fL (ref 7.5–12.5)
Monocytes Relative: 8.6 %
Neutro Abs: 2822 cells/uL (ref 1500–7800)
Neutrophils Relative %: 50.4 %
Platelets: 260 10*3/uL (ref 140–400)
RBC: 4.9 10*6/uL (ref 4.20–5.80)
RDW: 13.4 % (ref 11.0–15.0)
Total Lymphocyte: 39.8 %
WBC: 5.6 10*3/uL (ref 3.8–10.8)

## 2021-08-24 LAB — COMPLETE METABOLIC PANEL WITH GFR
AG Ratio: 1.5 (calc) (ref 1.0–2.5)
ALT: 70 U/L — ABNORMAL HIGH (ref 9–46)
AST: 40 U/L (ref 10–40)
Albumin: 4.5 g/dL (ref 3.6–5.1)
Alkaline phosphatase (APISO): 152 U/L — ABNORMAL HIGH (ref 36–130)
BUN: 18 mg/dL (ref 7–25)
CO2: 25 mmol/L (ref 20–32)
Calcium: 9.4 mg/dL (ref 8.6–10.3)
Chloride: 103 mmol/L (ref 98–110)
Creat: 0.7 mg/dL (ref 0.60–1.26)
Globulin: 3.1 g/dL (calc) (ref 1.9–3.7)
Glucose, Bld: 104 mg/dL — ABNORMAL HIGH (ref 65–99)
Potassium: 3.9 mmol/L (ref 3.5–5.3)
Sodium: 139 mmol/L (ref 135–146)
Total Bilirubin: 0.4 mg/dL (ref 0.2–1.2)
Total Protein: 7.6 g/dL (ref 6.1–8.1)
eGFR: 121 mL/min/{1.73_m2} (ref >=60)

## 2021-08-24 LAB — EPSTEIN-BARR VIRUS VCA ANTIBODY PANEL
EBV NA IgG: >600 U/mL — ABNORMAL HIGH
EBV VCA IgG: 382 U/mL — ABNORMAL HIGH
EBV VCA IgM: >160 U/mL — ABNORMAL HIGH

## 2021-08-24 LAB — VARICELLA ZOSTER ANTIBODY, IGG: Varicella IgG: 3852 index

## 2021-08-24 LAB — CMV ABS, IGG+IGM (CYTOMEGALOVIRUS)
CMV IgM: 88.7 AU/mL — ABNORMAL HIGH
Cytomegalovirus Ab-IgG: 7.4 U/mL — ABNORMAL HIGH

## 2021-08-24 LAB — HERPES SIMPLEX VIRUS 1 AND 2 (IGG),REFLEX HSV-2 INHIBITION
HAV 1 IGG,TYPE SPECIFIC AB: <0.9 index
HSV 2 IGG,TYPE SPECIFIC AB: <0.9 index

## 2021-08-24 LAB — VARICELLA ZOSTER ANTIBODY, IGM: Varicella Zoster Ab IgM: <=0.9 (ref <=0.90)

## 2021-08-25 ENCOUNTER — Ambulatory Visit (HOSPITAL_COMMUNITY)
Admission: RE | Admit: 2021-08-25 | Discharge: 2021-08-25 | Disposition: A | Discharge status: Home / Self Care | Payer: 59 | Source: Ambulatory Visit | Attending: Gastroenterology | Admitting: Gastroenterology

## 2021-08-25 ENCOUNTER — Ambulatory Visit (HOSPITAL_COMMUNITY): Admission: RE | Admit: 2021-08-25 | Payer: 59 | Source: Ambulatory Visit

## 2021-08-25 DIAGNOSIS — R7989 Other specified abnormal findings of blood chemistry: Secondary | ICD-10-CM | POA: Insufficient documentation

## 2021-08-25 DIAGNOSIS — K219 Gastro-esophageal reflux disease without esophagitis: Secondary | ICD-10-CM | POA: Diagnosis present

## 2021-09-01 ENCOUNTER — Ambulatory Visit (INDEPENDENT_AMBULATORY_CARE_PROVIDER_SITE_OTHER): Payer: 59 | Admitting: Gastroenterology

## 2021-09-01 DIAGNOSIS — R7989 Other specified abnormal findings of blood chemistry: Secondary | ICD-10-CM

## 2021-09-01 DIAGNOSIS — Z23 Encounter for immunization: Secondary | ICD-10-CM

## 2021-09-07 ENCOUNTER — Telehealth: Payer: Self-pay | Admitting: Gastroenterology

## 2021-09-07 NOTE — Telephone Encounter (Signed)
Spoke with pt: Documented under result note:

## 2021-09-07 NOTE — Telephone Encounter (Signed)
Inbound call from patient returning call. Please give a call to further advise.

## 2021-09-08 NOTE — Progress Notes (Signed)
Fott hep B

## 2021-09-26 ENCOUNTER — Ambulatory Visit (INDEPENDENT_AMBULATORY_CARE_PROVIDER_SITE_OTHER): Payer: 59 | Admitting: Allergy and Immunology

## 2021-09-26 VITALS — BP 124/78 | HR 74 | Temp 98.9°F | Resp 16 | Ht 76.0 in | Wt 204.4 lb

## 2021-09-26 DIAGNOSIS — R760 Raised antibody titer: Secondary | ICD-10-CM

## 2021-09-26 DIAGNOSIS — R768 Other specified abnormal immunological findings in serum: Secondary | ICD-10-CM

## 2021-09-26 DIAGNOSIS — R7989 Other specified abnormal findings of blood chemistry: Secondary | ICD-10-CM | POA: Diagnosis not present

## 2021-09-26 NOTE — Progress Notes (Signed)
[facility] [place] [facility] [place] [facility] [place] [facility] [place]   Dear Dr. Garlon Hatchet,  Thank you for referring Ronnie Middleton to the [facility] on 09/26/2021.   Below is a summation of this patient's evaluation and recommendations.  Thank you for your referral. I Ronnie Middleton keep you informed about this patient's response to treatment.   If you have any questions please do not hesitate to contact me.   Sincerely,  Jiles Prows, MD Allergy / Immunology [place]   ______________________________________________________________________    NEW PATIENT NOTE  Referring Provider: Algis Greenhouse, MD Primary Provider: Algis Greenhouse, MD Date of office visit: 09/26/2021    Subjective:   Chief Complaint:  Ronnie Middleton (DOB: 25-Nov-1980) is a 41 y.o. male who presents to the clinic on 09/26/2021 with a chief complaint of Frequent Infections (June hospitalization for four days with blood clots, mono, fatty liver. Went to infectious disease. While in [place] was hospital for three weeks and they stated mono and strep throat.  ) .     HPI: Ronnie Middleton presents to this clinic in evaluation of possible immune defects.  He has a very interesting history with identification of elevated liver function test in 2016 apparently with negative diagnostic studies including both imaging studies and blood tests.    Sometime in 2019 while visiting [place] he required hospitalization for what sounded like lymphadenopathy and liver problems and kidney problems and possible strep infection and possible mononucleosis and he was treated with antivirals and 3 antibiotics.    In April 2023 he developed problems with fatigue and apparently ended up in the emergency room in June 2023 with significant fatigue and fever and then within a few days had hemoptysis secondary to a pulmonary embolus for which he is now using Eliquis.  Empiric therapy  for pneumonia was also prescribed at that point in time as he did have some apparent imaging studies of his lung showing possible infection.  Blood test have identified persistent elevations in various antiviral antibodies including both EBV and CMV and persistently elevated liver function tests.  He does not have a history of recurrent infections other than described above and does not have a history of other significant organ dysfunction or significant atopic disease.  Past Medical History:  Diagnosis Date   COVID 2022   Mononucleosis    Strep throat     Past Surgical History:  Procedure Laterality Date   WISDOM TOOTH EXTRACTION      Allergies as of 09/26/2021   No Known Allergies      Medication List    acetaminophen 325 MG tablet Commonly known as: TYLENOL Take 2 tablets (650 mg total) by mouth every 6 (six) hours as needed for mild pain, headache or fever.   apixaban 5 MG Tabs tablet Commonly known as: ELIQUIS Take 1 tablet (5 mg total) by mouth 2 (two) times daily. Start when you finish starter pack   PriLOSEC OTC 20 MG tablet Generic drug: omeprazole Take 1 tablet by mouth daily.    Review of systems negative except as noted in HPI / PMHx or noted below:  Review of Systems  Constitutional: Negative.   HENT: Negative.    Eyes: Negative.   Respiratory: Negative.    Cardiovascular: Negative.   Gastrointestinal: Negative.   Genitourinary: Negative.   Musculoskeletal: Negative.   Skin: Negative.   Neurological: Negative.   Endo/Heme/Allergies: Negative.  Psychiatric/Behavioral: Negative.      Family History  Problem Relation Age of Onset   Anxiety disorder Mother    Hyperlipidemia Father    Prostate cancer Maternal Grandfather    Colon cancer Neg Hx    Rectal cancer Neg Hx    Stomach cancer Neg Hx    Esophageal cancer Neg Hx     Social History   Socioeconomic History   Marital status: Married    Spouse name: Not on file   Number of children: 1    Years of education: Not on file   Highest education level: Not on file  Occupational History   Not on file  Tobacco Use   Smoking status: Never    Passive exposure: Never   Smokeless tobacco: Never  Vaping Use   Vaping Use: Never used  Substance and Sexual Activity   Alcohol use: Not Currently   Drug use: Not Currently   Sexual activity: Yes  Other Topics Concern   Not on file  Social History Narrative   Not on file   Environmental and Social history  Lives in a apartment with a dry environment, no animals located inside the household, and carpet in the bedroom, no plastic on the bed, no plastic on the pillow, no smoking ongoing inside the household.  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  Objective:   Vitals:   09/26/21 0920  BP: 124/78  Pulse: 74  Resp: 16  Temp: 98.9 F (37.2 C)  SpO2: 99%   Height: '6\' 4"'$  (193 cm) Weight: 204 lb 6.4 oz (92.7 kg)  Physical Exam Constitutional:      Appearance: He is not diaphoretic.  HENT:     Head: Normocephalic.     Right Ear: Tympanic membrane, ear canal and external ear normal.     Left Ear: Tympanic membrane, ear canal and external ear normal.     Nose: Nose normal. No mucosal edema or rhinorrhea.     Mouth/Throat:     Pharynx: Uvula midline. No oropharyngeal exudate.  Eyes:     Conjunctiva/sclera: Conjunctivae normal.  Neck:     Thyroid: No thyromegaly.     Trachea: Trachea normal. No tracheal tenderness or tracheal deviation.  Cardiovascular:     Rate and Rhythm: Normal rate and regular rhythm.     Heart sounds: Normal heart sounds, S1 normal and S2 normal. No murmur heard. Pulmonary:     Effort: No respiratory distress.     Breath sounds: Normal breath sounds. No stridor. No wheezing or rales.  Lymphadenopathy:     Head:     Right side of head: No tonsillar adenopathy.     Left side of head: No tonsillar adenopathy.     Cervical: No cervical adenopathy.  Skin:     Findings: No erythema or rash.     Nails: There is no clubbing.  Neurological:     Mental Status: He is alert.     Diagnostics: Allergy skin tests were not performed.   Results of blood tests obtained 21 August 2021 identifies creatinine 0.70 MGs/DL, AST 40 U/L, ALT 70 U/L, GGT 94 U/L, total bilirubin 0.4 mg/DL, WBC 5.6, absolute eosinophil 39, absolute lymphocyte 2229, hemoglobin 14.9, platelet 260, CMV IgM 88.70 U/mL, EBV VCA IgM greater than 160 U/mL.  Results of blood tests obtained 17 August 2021 identifies positive ANA at 1: 40 with speckled nuclear and cytoplasmic pattern, alpha-1 antitrypsin 129 MGs/DL, antiactin/smooth muscle antibody  29 U/L, ceruloplasmin 39 mg/DL, antimitochondrial antibody less than 20 U/L  Results of blood tests obtained 31 July 2021 identifies anticardiolipin IgM 14 U/mL, PTT lupus anticoagulant 47.1, DRVVT 83.2, PTT-LA 44.0, beta-2 glycoprotein IgA/G/M less than 9U/L.  Results of a chest CT angio obtained 17 July 2021 identified the following:  Cardiovascular: Multiple pulmonary artery filling defects identified consistent with emboli, largest in the right lower lobe proximal segmental to subsegmental branches. Additional small emboli are mostly subsegmental. No saddle embolism identified. Main pulmonary artery is normal caliber. Heart size is normal. No pericardial effusion. No evidence of right heart strain. Thoracic aorta is normal in course and caliber.   Mediastinum/Nodes: No bulky axillary, hilar or mediastinal lymphadenopathy identified.   Lungs/Pleura: Irregular ground-glass airspace densities the basilar and dependent aspect of the right lower lobe with adjacent subsegmental atelectatic changes. Small right pleural effusion. No pneumothorax.  Results of the abdominal CT scan obtained 04 July 2021 identifies the following:  Lower chest: No acute abnormality.  Small hiatal hernia.  Hepatobiliary: No focal liver abnormality is seen. No gallstones,  gallbladder wall thickening, or biliary dilatation.  Pancreas: Unremarkable. No pancreatic ductal dilatation or surrounding inflammatory changes.  Spleen: Normal in size without focal abnormality.  Adrenals/Urinary Tract: Adrenal glands are unremarkable. Kidneys are normal, without renal calculi, focal lesion, or hydronephrosis.Bladder is unremarkable.  Stomach/Bowel: Stomach is within normal limits. Appendix appears normal. No evidence of bowel wall thickening, distention, or inflammatory changes.  Vascular/Lymphatic: No significant vascular findings are present. No enlarged abdominal or pelvic lymph nodes. Shotty porta hepatic is lymph nodes, sub pathologic by CT criteria.  Reproductive: Prostate is unremarkable.  Other: No abdominal wall hernia or abnormality. No abdominopelvic ascites.  Musculoskeletal: No acute or significant osseous findings.  Results of echocardiogram obtained 18 July 2021 identifies the following:   1. Left ventricular ejection fraction, by estimation, is 65 to 70%. The  left ventricle has normal function. The left ventricle has no regional  wall motion abnormalities. Left ventricular diastolic parameters were  normal.   2. Right ventricular systolic function is normal. The right ventricular  size is normal. Tricuspid regurgitation signal is inadequate for assessing  PA pressure.   3. The mitral valve is normal in structure. No evidence of mitral valve  regurgitation. No evidence of mitral stenosis.   4. The aortic valve is tricuspid. Aortic valve regurgitation is not  visualized. No aortic stenosis is present.   5. The inferior vena cava is normal in size with greater than 50%  respiratory variability, suggesting right atrial pressure of 3 mmHg.   Assessment and Plan:    1. Elevated liver function tests   2. Positive serum cytomegalovirus (CMV) IgM antibody   3. Elevated EBV antibody titer    Ronnie Middleton had an unprovoked pulmonary embolus that may or may not have  been associated with an infectious disease of his lung in the context of having elevated IgM directed against cytomegalovirus and EBV and prolonged history of elevated LFT. It is not exactly clear what is going here as all these findings / events may be completely unrelated. It is possible that he had a multi-organ inflammatory event, such as vasculitis, whether it be associated with or without a infectious disease, but there does not appear any active inflammation at this point in time. There is no evidence of atopic disease contributing to anything that has developed to date.  There is no evidence that he has active or uncontrolled CMV or EBV viral replication. The  only test that needs to be performed is a SPEP with IF to further define the slim chance that there is a IGM gammopathy. Although measurement of CMV and EBV DNA would be interesting those test would not really change the approach currently used to address Ronnie Middleton's issues.   Jiles Prows, MD Allergy / Immunology [facility]

## 2021-10-03 ENCOUNTER — Ambulatory Visit (INDEPENDENT_AMBULATORY_CARE_PROVIDER_SITE_OTHER): Payer: 59 | Admitting: Internal Medicine

## 2021-10-03 ENCOUNTER — Encounter: Payer: Self-pay | Admitting: Internal Medicine

## 2021-10-03 ENCOUNTER — Other Ambulatory Visit: Payer: Self-pay

## 2021-10-03 ENCOUNTER — Encounter: Payer: Self-pay | Admitting: Allergy and Immunology

## 2021-10-03 VITALS — BP 112/72 | HR 78 | Temp 97.7°F | Ht 76.0 in | Wt 202.0 lb

## 2021-10-03 DIAGNOSIS — B2790 Infectious mononucleosis, unspecified without complication: Secondary | ICD-10-CM

## 2021-10-03 DIAGNOSIS — D472 Monoclonal gammopathy: Secondary | ICD-10-CM

## 2021-10-03 NOTE — Progress Notes (Signed)
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   APIXABAN (ELIQUIS) 5 MG TABS TABLET    Take 1 tablet (5 mg total) by mouth 2 (two) times daily. Start when you finish starter pack   OMEPRAZOLE (PRILOSEC OTC) 20 MG TABLET    Take 1 tablet by mouth daily.  Modified Medications   No medications on file  Discontinued Medications   No medications on file    Subjective: 41 year old male with history of mono in [address] complicated by chronic elevated LFTs, COVID-19 2022 presents with concern for chronic EBV infection.  He was hospitalized at [facility] from 6/19 - 6/22 after presented with hemoptysis and fatigue.  He was found to have subsegmental PE and placed on Eliquis on discharge.  Also CT renal study showed right lower lobe infiltrate concerning for pneumonia and or pneumonitis no spinal megaly or hepatomegaly noted.  He was treated with ceftriaxone, azithromycin inpatient, recurrent pneumonia and discharged on Augmentin and azithromycin placed 5 days of antibiotics.  His EBV IgM VCA > 160returned positive during hospitalization felt to be likely reactivation.  Also noted to have elevated LFTs thought to be in the setting of EBV IgM positive just a reactive activation.  Given GI follow-up.  Seen by PCP and referred to infectious disease. Seen by GI with Dr. Carmell Austria on 7/20.  Noted that acute found hepatitis panel on 6/23, iron studies, ultrasound, 2D echo, CT abdomen, 06/2021 were all  negative.  EBV VCA IgM greater than 160 on 6/19 test 2023.  Ordered work-up for elevated LFTs, anti-HCV, hep B antibody, if LFT work-up was negative for concerning liver biopsy.  Plan EGD to rule out Barrett's in the future.  Follow-up in 3 to 4 months. 7/24 pt reports he lived in [place] for 8 years. In march 2019, in [place] he was hospitalized for kidney and bladder infection. He was initially treated for strep throat at a facility for strep throat with IV abx after fatigue for about a month. His renal function worsened and found to have a UTI. His work up also revealed he had infectious mono and antivirals were added. Reports LFTs have been elevated since. This time reports fatigue since may, initially presented to ED on 6/6 with low grade fever. Then returned as he had hemoptysis. He had no rash, sorethroat, fever and chills.   Today 9/5: Pt presents with wife and medical records from [place]. He is doing well. No new complaints.   Review of Systems: Review of Systems  All other systems reviewed and are negative.   Past Medical History:  Diagnosis Date   COVID 2022   Mononucleosis    Strep throat     Social History   Tobacco Use   Smoking status: Never    Passive exposure: Never   Smokeless tobacco: Never  Vaping Use   Vaping Use: Never used  Substance Use Topics   Alcohol use: Not  Currently   Drug use: Not Currently    Family History  Problem Relation Age of Onset   Anxiety disorder Mother    Hyperlipidemia Father    Prostate cancer Maternal Grandfather    Colon cancer Neg Hx    Rectal cancer Neg Hx    Stomach cancer Neg Hx    Esophageal cancer Neg Hx     No Known Allergies  Health Maintenance  Topic Date Due   COVID-19 Vaccine (1) Never done   TETANUS/TDAP  Never done   INFLUENZA VACCINE  Never done   Hepatitis C Screening  Completed   HIV Screening  Completed   HPV VACCINES  Aged Out    Objective:  Vitals:   10/03/21 0833  BP: 112/72  Pulse: 78  Temp: 97.7  F (36.5 C)  TempSrc: Oral  SpO2: 94%  Weight: 202 lb (91.6 kg)  Height: '6\' 4"'$  (1.93 m)   Body mass index is 24.59 kg/m.  Physical Exam Constitutional:      General: He is not in acute distress.    Appearance: He is normal weight. He is not toxic-appearing.  HENT:     Head: Normocephalic and atraumatic.     Right Ear: External ear normal.     Left Ear: External ear normal.     Nose: No congestion or rhinorrhea.     Mouth/Throat:     Mouth: Mucous membranes are moist.     Pharynx: Oropharynx is clear.  Eyes:     Extraocular Movements: Extraocular movements intact.     Conjunctiva/sclera: Conjunctivae normal.     Pupils: Pupils are equal, round, and reactive to light.  Cardiovascular:     Rate and Rhythm: Normal rate and regular rhythm.     Heart sounds: No murmur heard.    No friction rub. No gallop.  Pulmonary:     Effort: Pulmonary effort is normal.     Breath sounds: Normal breath sounds.  Abdominal:     General: Abdomen is flat. Bowel sounds are normal.     Palpations: Abdomen is soft.  Musculoskeletal:        General: No swelling. Normal range of motion.     Cervical back: Normal range of motion and neck supple.  Skin:    General: Skin is warm and dry.  Neurological:     General: No focal deficit present.     Mental Status: He is oriented to person, place, and time.  Psychiatric:        Mood and Affect: Mood normal.     Lab Results Lab Results  Component Value Date   WBC 5.6 08/21/2021   HGB 14.9 08/21/2021   HCT 43.6 08/21/2021   MCV 89.0 08/21/2021   PLT 260 08/21/2021    Lab Results  Component Value Date   CREATININE 0.70 08/21/2021   BUN 18 08/21/2021   NA 139 08/21/2021   K 3.9 08/21/2021   CL 103 08/21/2021   CO2 25 08/21/2021    Lab Results  Component Value Date   ALT 70 (H) 08/21/2021   AST 40 08/21/2021   ALKPHOS 189 (H) 07/31/2021   BILITOT 0.4 08/21/2021    No results found for: "CHOL", "HDL", "LDLCALC", "LDLDIRECT", "TRIG",  "CHOLHDL" No results found for: "LABRPR", "RPRTITER" No results found for: "HIV1RNAQUANT", "HIV1RNAVL", "CD4TABS"   #EBV IgM + 6/21 2/2 reactivation vs false positive #Elevated LFTs -EBV VCA IgM >160 on 6/20 -CT AP on 07/04/21 showed normal liver -Patient reports he had  positive EBV test in [place] in 2019.  He was initially diagnosed with strep throat but then was hospitalized for UTI.  Reports he was in the hospital for about 3 weeks and was diagnosed with mono. - During this hospitalization when his EBV was positive patient presented to the ED on 6/6 with fever.  Noted to have mildly elevated LFTs.  This admission has PT/INR was normal CT renal study and RUQ [place]  did not show splenomegaly or hepatomegaly, patient had no pharyngitis/tonsillitis, rash.  -Pt brought in labs while hospitalized in [place] in 0219. Noted that EBV DNA 3.42e^5(05/25/21)->8.12e^3(06/04/21) following  supportive management for mono(cutoff 5e^3) -ID work up on 7/24 showed: HSV IgM(>160), IgG, NA igG all positive, CMV igM (88.70) positive, CMV IgG positive. VZV igm negative, HSV IGG negative -Seen by allergy on 8/29 -Follow-up with Hematology(Dr. Lorenso Courier) , pt on eliquis (unprovoked PE)Jan 5th Plan: -EBV DNA   Given positive EBV IgM and CMV while pt was clinically asymptomatic 2 months from illness seem to be c/w cross reactivity rather than EBV reactivation. He has no other clinical symptoms at this time as such do not suspect chronic active EBV at this time(no fevers, pancytopenia) he does have abnormal LFTs 2/2 unclear etiology as such following with GI(w/u pending). Will check for eBV viremia.  -Follow-up in 7 months after hematology visit   Laurice Record, [facility] 10/03/2021, 9:30 AM

## 2021-10-03 NOTE — Progress Notes (Deleted)
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   APIXABAN (ELIQUIS) 5 MG TABS TABLET    Take 1 tablet (5 mg total) by mouth 2 (two) times daily. Start when you finish starter pack   OMEPRAZOLE (PRILOSEC OTC) 20 MG TABLET    Take 1 tablet by mouth daily.  Modified Medications   No medications on file  Discontinued Medications   No medications on file    Subjective: ***   Review of Systems: ROS  Past Medical History:  Diagnosis Date   COVID 2022   Mononucleosis    Strep throat     Social History   Tobacco Use   Smoking status: Never    Passive exposure: Never   Smokeless tobacco: Never  Vaping Use   Vaping Use: Never used  Substance Use Topics   Alcohol use: Not Currently   Drug use: Not Currently    Family History  Problem Relation Age of Onset   Anxiety disorder Mother    Hyperlipidemia Father    Prostate cancer Maternal Grandfather    Colon cancer Neg Hx    Rectal cancer Neg Hx    Stomach cancer Neg Hx    Esophageal cancer Neg Hx     No Known Allergies  Health Maintenance  Topic Date Due   COVID-19 Vaccine (1) Never done   TETANUS/TDAP  Never done   INFLUENZA VACCINE  Never done   Hepatitis C Screening  Completed   HIV Screening  Completed   HPV VACCINES  Aged Out    Objective:  There were no vitals filed for this visit. There is no height or weight on file to calculate BMI.  Physical Exam  Lab Results Lab Results  Component  Value Date   WBC 5.6 08/21/2021   HGB 14.9 08/21/2021   HCT 43.6 08/21/2021   MCV 89.0 08/21/2021   PLT 260 08/21/2021    Lab Results  Component Value Date   CREATININE 0.70 08/21/2021   BUN 18 08/21/2021   NA 139 08/21/2021   K 3.9 08/21/2021   CL 103 08/21/2021   CO2 25 08/21/2021    Lab Results  Component Value Date   ALT 70 (H) 08/21/2021   AST 40 08/21/2021   ALKPHOS 189 (H) 07/31/2021   BILITOT 0.4 08/21/2021    No results found for: "CHOL", "HDL", "LDLCALC", "LDLDIRECT", "TRIG", "CHOLHDL" No results found for: "LABRPR", "RPRTITER" No results found for: "HIV1RNAQUANT", "HIV1RNAVL", "CD4TABS"   #EBV IgM + 6/21 2/2 reactivation vs false positive #Elevated LFTs -EBV VCA IgM >160 on 6/20 -CT AP on 07/04/21 showed normal liver -Patient reports he had positive EBV test in [place] in 2019.  He was initially diagnosed with strep throat but then was hospitalized for UTI.  Reports he was in the hospital for about 3 weeks and was diagnosed with mono. - During this hospitalization when his EBV was positive patient presented to the ED  on 6/6 with fever.  Noted to have mildly elevated LFTs.  This admission has PT/INR was normal CT renal study and RUQ [place]  did not show splenomegaly or hepatomegaly, patient had no pharyngitis/tonsillitis, rash.  Clinically, his history does not seem consistent with an mono infection. As such he likely had  reactivation leading to positive serology. -immune reactivation unlikly as pt was about 82month from illness. FP possible as cmv and ebv igm postive vs undelrying malignancy -Dr. DLorenso CourierJan 5th -dna today   MLaurice Record [facility] [place] 10/03/2021, 5:56 AM

## 2021-10-04 ENCOUNTER — Telehealth: Payer: Self-pay

## 2021-10-04 DIAGNOSIS — R768 Other specified abnormal immunological findings in serum: Secondary | ICD-10-CM

## 2021-10-04 LAB — EPSTEIN BARR VIRUS DNA, QUANT RTPCR
EBV DNA, QN PCR: NOT DETECTED Log cps/mL
EBV DNA, QN PCR: NOT DETECTED copies/mL

## 2021-10-04 NOTE — Addendum Note (Signed)
Addended by: Zandra Abts on: 10/04/2021 09:42 AM   Modules accepted: Orders

## 2021-10-04 NOTE — Telephone Encounter (Signed)
-----   Message from Jiles Prows, MD sent at 10/03/2021  8:24 AM EDT ----- Please contact well and inform him that I reviewed all of his previous blood test and all of his medical records.  The only test I think we need to perform at this point in time is SPEP with reflex to immunofixation for the diagnosis of elevated IgM.

## 2021-10-04 NOTE — Telephone Encounter (Signed)
Placed [facility] order and sent MyChart message to patient.

## 2021-10-05 NOTE — Telephone Encounter (Signed)
Called and informed patient of Dr. Bruna Potter message. He is planning to go to [facility] to have test drawn.

## 2021-10-26 ENCOUNTER — Ambulatory Visit: Payer: Self-pay | Admitting: Allergy and Immunology

## 2022-02-02 ENCOUNTER — Inpatient Hospital Stay (HOSPITAL_BASED_OUTPATIENT_CLINIC_OR_DEPARTMENT_OTHER): Payer: 59 | Admitting: Hematology and Oncology

## 2022-02-02 ENCOUNTER — Inpatient Hospital Stay: Payer: 59 | Attending: Hematology and Oncology

## 2022-02-02 ENCOUNTER — Other Ambulatory Visit: Payer: Self-pay | Admitting: Hematology and Oncology

## 2022-02-02 ENCOUNTER — Other Ambulatory Visit: Payer: Self-pay

## 2022-02-02 VITALS — BP 125/74 | HR 80 | Temp 98.3°F | Resp 16 | Wt 200.6 lb

## 2022-02-02 DIAGNOSIS — Z7901 Long term (current) use of anticoagulants: Secondary | ICD-10-CM | POA: Insufficient documentation

## 2022-02-02 DIAGNOSIS — Z86711 Personal history of pulmonary embolism: Secondary | ICD-10-CM | POA: Diagnosis present

## 2022-02-02 DIAGNOSIS — Z79899 Other long term (current) drug therapy: Secondary | ICD-10-CM | POA: Insufficient documentation

## 2022-02-02 DIAGNOSIS — I2699 Other pulmonary embolism without acute cor pulmonale: Secondary | ICD-10-CM

## 2022-02-02 LAB — CBC WITH DIFFERENTIAL (CANCER CENTER ONLY)
Abs Immature Granulocytes: 0.01 10*3/uL (ref 0.00–0.07)
Basophils Absolute: 0 10*3/uL (ref 0.0–0.1)
Basophils Relative: 0 %
Eosinophils Absolute: 0.1 10*3/uL (ref 0.0–0.5)
Eosinophils Relative: 1 %
HCT: 42.5 % (ref 39.0–52.0)
Hemoglobin: 14.8 g/dL (ref 13.0–17.0)
Immature Granulocytes: 0 %
Lymphocytes Relative: 41 %
Lymphs Abs: 2.8 10*3/uL (ref 0.7–4.0)
MCH: 30.7 pg (ref 26.0–34.0)
MCHC: 34.8 g/dL (ref 30.0–36.0)
MCV: 88.2 fL (ref 80.0–100.0)
Monocytes Absolute: 0.4 10*3/uL (ref 0.1–1.0)
Monocytes Relative: 6 %
Neutro Abs: 3.4 10*3/uL (ref 1.7–7.7)
Neutrophils Relative %: 52 %
Platelet Count: 321 10*3/uL (ref 150–400)
RBC: 4.82 MIL/uL (ref 4.22–5.81)
RDW: 13.1 % (ref 11.5–15.5)
WBC Count: 6.7 10*3/uL (ref 4.0–10.5)
nRBC: 0 % (ref 0.0–0.2)

## 2022-02-02 LAB — CMP (CANCER CENTER ONLY)
ALT: 57 U/L — ABNORMAL HIGH (ref 0–44)
AST: 33 U/L (ref 15–41)
Albumin: 4.5 g/dL (ref 3.5–5.0)
Alkaline Phosphatase: 136 U/L — ABNORMAL HIGH (ref 38–126)
Anion gap: 3 — ABNORMAL LOW (ref 5–15)
BUN: 18 mg/dL (ref 6–20)
CO2: 31 mmol/L (ref 22–32)
Calcium: 9.5 mg/dL (ref 8.9–10.3)
Chloride: 106 mmol/L (ref 98–111)
Creatinine: 0.82 mg/dL (ref 0.61–1.24)
GFR, Estimated: >60 mL/min (ref >60)
Glucose, Bld: 92 mg/dL (ref 70–99)
Potassium: 3.9 mmol/L (ref 3.5–5.1)
Sodium: 140 mmol/L (ref 135–145)
Total Bilirubin: 0.5 mg/dL (ref 0.3–1.2)
Total Protein: 6.9 g/dL (ref 6.5–8.1)

## 2022-02-02 MED ORDER — APIXABAN 2.5 MG PO TABS: 2.5000 mg | ORAL_TABLET | Freq: Two times a day (BID) | ORAL | 2 refills | Status: DC

## 2022-02-02 NOTE — Progress Notes (Signed)
[place] [phone]   [phone]  PROGRESS NOTE  Patient Care Team: Algis Greenhouse, MD as PCP - General (Family Medicine)  Hematological/Oncological History # Unprovoked Pulmonary Embolism 1) 07/17/2021-07/20/2021: Admitted for subsegmental pulmonary emboli. He was treated with IV heparin. Doppler US was negative for DVT. Additionally, CT imaging showed RLL infiltrate concerning for pneumonia and/or pneumonitis. Patient was treated on CTX, Azithromycin and robaxin. He was discharged on Augmentin and Azithromycin x 3 days along with eliquis for PE.  2) 07/31/2021: Establish care with [facility] Hematology  Interval History:  Ronnie Middleton 42 y.o. male with medical history significant for unprovoked PE who presents for a follow up visit. The patient's last visit was on 07/31/2021. In the interim since the last visit he has had no major changes in his health.  On exam today Ronnie Middleton reports that he has been tolerating his Eliquis 5 mg twice daily well without any difficulty.  He is not having any bleeding or bruising.  He reports that he did unfortunately run out of the Eliquis at 1 point in time and was off it for approximately 1 month.  He notes he waited until he saw his PCP to have this refilled.  He notes that his weight has been decreasing.  He notes that he did once nicked himself while shaving and it "bled freely" but he was able to get under control.  He notes the cost is not an issue for the medication.  He notes that he has not had any COVID vaccine though he did have a COVID infection in 2022.  He otherwise denies any fevers, chills, sweats, nausea, Mi or diarrhea.  A full 10 point ROS was otherwise negative.  MEDICAL HISTORY:  Past Medical History:  Diagnosis Date   COVID 2022   Mononucleosis    Strep throat     SURGICAL HISTORY: Past Surgical History:  Procedure Laterality Date   WISDOM TOOTH EXTRACTION      SOCIAL HISTORY: Social History    Socioeconomic History   Marital status: Married    Spouse name: Not on file   Number of children: 1   Years of education: Not on file   Highest education level: Not on file  Occupational History   Not on file  Tobacco Use   Smoking status: Never    Passive exposure: Never   Smokeless tobacco: Never  Vaping Use   Vaping Use: Never used  Substance and Sexual Activity   Alcohol use: Not Currently   Drug use: Not Currently   Sexual activity: Yes  Other Topics Concern   Not on file  Social History Narrative   Not on file   Social Determinants of Health   Financial Resource Strain: Not on file  Food Insecurity: Not on file  Transportation Needs: Not on file  Physical Activity: Not on file  Stress: Not on file  Social Connections: Not on file  Intimate Partner Violence: Not on file    FAMILY HISTORY: Family History  Problem Relation Age of Onset   Anxiety disorder Mother    Hyperlipidemia Father    Prostate cancer Maternal Grandfather    Colon cancer Neg Hx    Rectal cancer Neg Hx    Stomach cancer Neg Hx    Esophageal cancer Neg Hx     ALLERGIES:  has No Known Allergies.  MEDICATIONS:  Current Outpatient Medications  Medication Sig Dispense Refill   acetaminophen (TYLENOL) 325 MG tablet Take 2  tablets (650 mg total) by mouth every 6 (six) hours as needed for mild pain, headache or fever.     apixaban (ELIQUIS) 2.5 MG TABS tablet Take 1 tablet (2.5 mg total) by mouth 2 (two) times daily. 180 tablet 2   omeprazole (PRILOSEC OTC) 20 MG tablet Take 1 tablet by mouth daily.     No current facility-administered medications for this visit.    REVIEW OF SYSTEMS:   Constitutional: ( - ) fevers, ( - )  chills , ( - ) night sweats Eyes: ( - ) blurriness of vision, ( - ) double vision, ( - ) watery eyes Ears, nose, mouth, throat, and face: ( - ) mucositis, ( - ) sore throat Respiratory: ( - ) cough, ( - ) dyspnea, ( - ) wheezes Cardiovascular: ( - ) palpitation, ( - )  chest discomfort, ( - ) lower extremity swelling Gastrointestinal:  ( - ) nausea, ( - ) heartburn, ( - ) change in bowel habits Skin: ( - ) abnormal skin rashes Lymphatics: ( - ) new lymphadenopathy, ( - ) easy bruising Neurological: ( - ) numbness, ( - ) tingling, ( - ) new weaknesses Behavioral/Psych: ( - ) mood change, ( - ) new changes  All other systems were reviewed with the patient and are negative.  PHYSICAL EXAMINATION:  Vitals:   02/02/22 1423  BP: 125/74  Pulse: 80  Resp: 16  Temp: 98.3 F (36.8 C)  SpO2: 99%   Filed Weights   02/02/22 1423  Weight: 200 lb 9.6 oz (91 kg)    GENERAL: well appearing middle aged Caucasian male alert, no distress and comfortable SKIN: skin color, texture, turgor are normal, no rashes or significant lesions EYES: conjunctiva are pink and non-injected, sclera clear LUNGS: clear to auscultation and percussion with normal breathing effort HEART: regular rate & rhythm and no murmurs and no lower extremity edema Musculoskeletal: no cyanosis of digits and no clubbing  PSYCH: alert & oriented x 3, fluent speech NEURO: no focal motor/sensory deficits  LABORATORY DATA:  I have reviewed the data as listed    Latest Ref Rng & Units 02/02/2022    1:38 PM 08/21/2021    3:12 PM 07/31/2021   12:33 PM  CBC  WBC 4.0 - 10.5 K/uL 6.7  5.6  6.2   Hemoglobin 13.0 - 17.0 g/dL 14.8  14.9  14.3   Hematocrit 39.0 - 52.0 % 42.5  43.6  41.7   Platelets 150 - 400 K/uL 321  260  372        Latest Ref Rng & Units 02/02/2022    1:38 PM 08/21/2021    3:12 PM 07/31/2021   12:33 PM  CMP  Glucose 70 - 99 mg/dL 92  104  89   BUN 6 - 20 mg/dL '18  18  17   '$ Creatinine 0.61 - 1.24 mg/dL 0.82  0.70  0.82   Sodium 135 - 145 mmol/L 140  139  138   Potassium 3.5 - 5.1 mmol/L 3.9  3.9  3.9   Chloride 98 - 111 mmol/L 106  103  102   CO2 22 - 32 mmol/L '31  25  31   '$ Calcium 8.9 - 10.3 mg/dL 9.5  9.4  9.7   Total Protein 6.5 - 8.1 g/dL 6.9  7.6  8.0   Total Bilirubin 0.3 -  1.2 mg/dL 0.5  0.4  0.5   Alkaline Phos 38 - 126 U/L 136   189  AST 15 - 41 U/L 33  40  51   ALT 0 - 44 U/L 57  70  91     RADIOGRAPHIC STUDIES: No results found.  ASSESSMENT & PLAN Ronnie Middleton 42 y.o. male with medical history significant for unprovoked PE who presents for a follow up visit.  #Unprovoked Pulmonary Embolism --findings at this time are consistent with a unprovoked VTE --will order CBC, CMP at each visit -- labs today show white blood cell 6.7, hemoglobin 14.8, MCV 88.2, and platelets of 321 --recommend the patient continue eliquis '5mg'$  BID --patient denies any bleeding, bruising, or dark stools on this medication. It is well tolerated. No difficulties accessing/affording the medication --RTC in 6 months' time with strict return precautions for overt signs of bleeding.   No orders of the defined types were placed in this encounter.   All questions were answered. The patient knows to call the clinic with any problems, questions or concerns.  A total of more than 25 minutes were spent on this encounter with face-to-face time and non-face-to-face time, including preparing to see the patient, ordering tests and/or medications, counseling the patient and coordination of care as outlined above.   Ledell Peoples, MD Department of Hematology/Oncology [place] [facility] Phone: [phone] Pager: [phone] Email: [email or web address]  02/18/2022 4:06 PM

## 2022-03-05 ENCOUNTER — Ambulatory Visit (INDEPENDENT_AMBULATORY_CARE_PROVIDER_SITE_OTHER): Payer: 59 | Admitting: Gastroenterology

## 2022-03-05 DIAGNOSIS — R7989 Other specified abnormal findings of blood chemistry: Secondary | ICD-10-CM | POA: Diagnosis not present

## 2022-03-05 DIAGNOSIS — Z23 Encounter for immunization: Secondary | ICD-10-CM | POA: Diagnosis not present

## 2022-05-07 ENCOUNTER — Other Ambulatory Visit: Payer: Self-pay

## 2022-05-07 ENCOUNTER — Ambulatory Visit (INDEPENDENT_AMBULATORY_CARE_PROVIDER_SITE_OTHER): Payer: 59 | Admitting: Internal Medicine

## 2022-05-07 ENCOUNTER — Encounter: Payer: Self-pay | Admitting: Internal Medicine

## 2022-05-07 VITALS — BP 120/81 | HR 80 | Resp 16 | Ht 76.0 in | Wt 206.0 lb

## 2022-05-07 DIAGNOSIS — D472 Monoclonal gammopathy: Secondary | ICD-10-CM

## 2022-05-07 NOTE — Progress Notes (Unsigned)
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   APIXABAN (ELIQUIS) 2.5 MG TABS TABLET    Take 1 tablet (2.5 mg total) by mouth 2 (two) times daily.   OMEPRAZOLE (PRILOSEC OTC) 20 MG TABLET    Take 1 tablet by mouth daily.  Modified Medications   No medications on file  Discontinued Medications   No medications on file    Subjective: 42 year old male with history of mono in 2019 complicated by chronic elevated LFTs, COVID-19 2022 presents with concern for chronic EBV infection.  He was hospitalized at [facility] from 6/19 - 6/22 after presented with hemoptysis and fatigue.  He was found to have subsegmental PE and placed on Eliquis on discharge.  Also CT renal study showed right lower lobe infiltrate concerning for pneumonia and or pneumonitis no spinal megaly or hepatomegaly noted.  He was treated with ceftriaxone, azithromycin inpatient, recurrent pneumonia and discharged on Augmentin and azithromycin placed 5 days of antibiotics.  His EBV IgM VCA > 160returned positive during hospitalization felt to be likely reactivation.  Also noted to have elevated LFTs thought to be in the setting of EBV IgM positive just a reactive activation.  Given GI follow-up.  Seen by PCP and referred to infectious disease. Seen by GI with Dr. Edman Circleaj Gupta on 7/20.  Noted that acute found hepatitis panel on 6/23, iron studies, ultrasound, 2D echo, CT abdomen, 06/2021 were all negative.  EBV VCA IgM greater than  160 on 6/19 test 2023.  Ordered work-up for elevated LFTs, anti-HCV, hep B antibody, if LFT work-up was negative for concerning liver biopsy.  Plan EGD to rule out Barrett's in the future.  Follow-up in 3 to 4 months. 7/24 pt reports he lived in [place] for 8 years. In march 2019, in [place] he was hospitalized for kidney and bladder infection. He was initially treated for strep throat at a facility for strep throat with IV abx after fatigue for about a month. His renal function worsened and found to have a UTI. His work up also revealed he had infectious mono and antivirals were added. Reports LFTs have been elevated since. This time reports fatigue since may, initially presented to ED on 6/6 with low grade fever. Then returned as he had hemoptysis. He had no rash, sorethroat, fever and chills.  9/5: Pt presents with wife and medical records from [place]. He is doing well. No new complaints.  Today 05/07/22: No new complaints.  Review of Systems: Review of Systems  All other systems reviewed and are negative.   Past Medical History:  Diagnosis Date   COVID 2022   Mononucleosis    Strep throat     Social History   Tobacco Use   Smoking status: Never    Passive exposure: Never   Smokeless tobacco: Never  Vaping Use   Vaping Use: Never used  Substance Use Topics   Alcohol use: Not Currently   Drug  use: Not Currently    Family History  Problem Relation Age of Onset   Anxiety disorder Mother    Hyperlipidemia Father    Prostate cancer Maternal Grandfather    Colon cancer Neg Hx    Rectal cancer Neg Hx    Stomach cancer Neg Hx    Esophageal cancer Neg Hx     No Known Allergies  Health Maintenance  Topic Date Due   COVID-19 Vaccine (1) Never done   DTaP/Tdap/Td (1 - Tdap) Never done   INFLUENZA VACCINE  08/30/2022   Hepatitis C Screening  Completed   HIV Screening  Completed   HPV VACCINES  Aged Out    Objective:  There were no vitals filed for this visit. There is no height  or weight on file to calculate BMI.  Physical Exam Constitutional:      General: He is not in acute distress.    Appearance: He is normal weight. He is not toxic-appearing.  HENT:     Head: Normocephalic and atraumatic.     Right Ear: External ear normal.     Left Ear: External ear normal.     Nose: No congestion or rhinorrhea.     Mouth/Throat:     Mouth: Mucous membranes are moist.     Pharynx: Oropharynx is clear.  Eyes:     Extraocular Movements: Extraocular movements intact.     Conjunctiva/sclera: Conjunctivae normal.     Pupils: Pupils are equal, round, and reactive to light.  Cardiovascular:     Rate and Rhythm: Normal rate and regular rhythm.     Heart sounds: No murmur heard.    No friction rub. No gallop.  Pulmonary:     Effort: Pulmonary effort is normal.     Breath sounds: Normal breath sounds.  Abdominal:     General: Abdomen is flat. Bowel sounds are normal.     Palpations: Abdomen is soft.  Musculoskeletal:        General: No swelling. Normal range of motion.     Cervical back: Normal range of motion and neck supple.  Skin:    General: Skin is warm and dry.  Neurological:     General: No focal deficit present.     Mental Status: He is oriented to person, place, and time.  Psychiatric:        Mood and Affect: Mood normal.     Lab Results Lab Results  Component Value Date   WBC 6.7 02/02/2022   HGB 14.8 02/02/2022   HCT 42.5 02/02/2022   MCV 88.2 02/02/2022   PLT 321 02/02/2022    Lab Results  Component Value Date   CREATININE 0.82 02/02/2022   BUN 18 02/02/2022   NA 140 02/02/2022   K 3.9 02/02/2022   CL 106 02/02/2022   CO2 31 02/02/2022    Lab Results  Component Value Date   ALT 57 (H) 02/02/2022   AST 33 02/02/2022   ALKPHOS 136 (H) 02/02/2022   BILITOT 0.5 02/02/2022    No results found for: "CHOL", "HDL", "LDLCALC", "LDLDIRECT", "TRIG", "CHOLHDL" No results found for: "LABRPR", "RPRTITER" No results found for: "HIV1RNAQUANT",  "HIV1RNAVL", "CD4TABS"   Problem List Items Addressed This Visit   None  Assessment/Plan #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  He was initially diagnosed with strep throat but  then was hospitalized for UTI.  Reports he was in the hospital for about 3 weeks and was diagnosed with mono. - During this hospitalization when his EBV was positive patient presented to the ED on 6/6 with fever.  Noted to have mildly elevated LFTs.  This admission has PT/INR was normal CT renal study and RUQ [place]  did not show splenomegaly or hepatomegaly, patient had no pharyngitis/tonsillitis, rash.  -Pt brought in labs while hospitalized in [place] in 0219. Noted that EBV DNA 3.42e^5(05/25/21)->8.12e^3(06/04/21) following  supportive management for mono(cutoff 5e^3) -ID work up on 7/24 showed: HSV IgM(>160), IgG, NA igG all positive, CMV igM (88.70) positive, CMV IgG positive. VZV igm negative, HSV IGG negative. Acute hep panel negative on 07/18/21 -Seen by allergy on 8/29, work up was unrevealing per pt.  -Followed up with Hematology(Dr. Leonides Schanz) , pt on eliquis (unprovoked PE)Jan 5th.   Plan: -EBV DNA ND on 10/03/21. LFTs elevated but  stable on 02/02/22. As such, EBV IgM + likely due to cross reactivity given persistently elevated LFTs and negative DNA.  -He was seen by GI back in August, 2023 plan was to follow-up in 12-16 weeks for possible biopsy in the setting elevated LFTS. Recommend pt do GI follow-up.    Danelle Earthly, MD [facility] 05/07/2022, 8:26 AM   I have personally spent 40 minutes involved in face-to-face and non-face-to-face activities for this patient on the day of the visit. Professional time spent includes the following activities: Preparing to see the patient (review of tests), Obtaining and/or  reviewing separately obtained history (admission/discharge record), Performing a medically appropriate examination and/or evaluation , Ordering medications/tests/procedures, referring and communicating with other health care professionals, Documenting clinical information in the EMR, Independently interpreting results (not separately reported), Communicating results to the patient/family/caregiver, Counseling and educating the patient/family/caregiver and Care coordination (not separately reported).

## 2022-05-27 ENCOUNTER — Emergency Department (HOSPITAL_BASED_OUTPATIENT_CLINIC_OR_DEPARTMENT_OTHER)
Admission: EM | Admit: 2022-05-27 | Discharge: 2022-05-27 | Disposition: A | Discharge status: Home / Self Care | Payer: 59 | Attending: Emergency Medicine | Admitting: Emergency Medicine

## 2022-05-27 ENCOUNTER — Other Ambulatory Visit: Payer: Self-pay

## 2022-05-27 ENCOUNTER — Encounter (HOSPITAL_BASED_OUTPATIENT_CLINIC_OR_DEPARTMENT_OTHER): Payer: Self-pay | Admitting: *Deleted

## 2022-05-27 DIAGNOSIS — R21 Rash and other nonspecific skin eruption: Secondary | ICD-10-CM | POA: Diagnosis present

## 2022-05-27 DIAGNOSIS — L255 Unspecified contact dermatitis due to plants, except food: Secondary | ICD-10-CM

## 2022-05-27 DIAGNOSIS — L237 Allergic contact dermatitis due to plants, except food: Secondary | ICD-10-CM | POA: Insufficient documentation

## 2022-05-27 DIAGNOSIS — Z7901 Long term (current) use of anticoagulants: Secondary | ICD-10-CM | POA: Diagnosis not present

## 2022-05-27 MED ORDER — PREDNISONE 50 MG PO TABS
60.0000 mg | ORAL_TABLET | Freq: Once | ORAL | Status: AC
  Administered 2022-05-27: 60 mg via ORAL
  Filled 2022-05-27: qty 1

## 2022-05-27 MED ORDER — PREDNISONE 50 MG PO TABS: ORAL_TABLET | ORAL | 0 refills | Status: AC

## 2022-05-27 NOTE — ED Triage Notes (Signed)
Pt is here for rash from poison oak on bilateral lower legs.  Pt has been treating it at home with calamine lotion.

## 2022-05-27 NOTE — ED Notes (Signed)
VS done in triage but did not cross over.  Repeat VS once pt got to room

## 2022-05-27 NOTE — ED Provider Notes (Signed)
[place] EMERGENCY DEPARTMENT AT [facility] Provider Note   CSN: [id number] Arrival date & time: 05/27/22  0400     History  Chief Complaint  Patient presents with   Poison Ivy    Ronnie Middleton is a 42 y.o. male.  The history is provided by the patient.  Poison L[place]ce Corners This is a new problem. The current episode started more than 1 week ago. The problem occurs constantly. The problem has not changed since onset.Pertinent negatives include no chest pain, no abdominal pain, no headaches and no shortness of breath. Nothing aggravates the symptoms. Nothing relieves the symptoms. He has tried nothing for the symptoms. The treatment provided no relief.  Patient believe it is Oak dermatitis x 2 weeks.  Using calamine,       Home Medications Prior to Admission medications   Medication Sig Start Date End Date Taking? Authorizing Provider  predniSONE (DELTASONE) 50 MG tablet 1 tablet po QAM 05/27/22  Yes Browning Southwood, MD  acetaminophen (TYLENOL) 325 MG tablet Take 2 tablets (650 mg total) by mouth every 6 (six) hours as needed for mild pain, headache or fever. 07/19/21   Almon Hercules, MD  apixaban (ELIQUIS) 2.5 MG TABS tablet Take 1 tablet (2.5 mg total) by mouth 2 (two) times daily. 02/02/22   Jaci Standard, MD  omeprazole (PRILOSEC OTC) 20 MG tablet Take 1 tablet by mouth daily.    [provider]      Allergies    Patient has no known allergies.    Review of Systems   Review of Systems  Constitutional:  Negative for fever.  HENT:  Negative for facial swelling.   Eyes:  Negative for redness.  Respiratory:  Negative for shortness of breath.   Cardiovascular:  Negative for chest pain.  Gastrointestinal:  Negative for abdominal pain.  Skin:  Positive for rash.  Neurological:  Negative for headaches.  All other systems reviewed and are negative.   Physical Exam Updated Vital Signs BP 132/79   Pulse 83   Temp 98.6 F (37 C) (Oral)   Resp 16   SpO2 100%   Physical Exam Vitals and nursing note reviewed.  Constitutional:      General: He is not in acute distress.    Appearance: Normal appearance. He is well-developed. He is not diaphoretic.  HENT:     Head: Normocephalic and atraumatic.     Nose: Nose normal.  Eyes:     Conjunctiva/sclera: Conjunctivae normal.     Pupils: Pupils are equal, round, and reactive to light.  Cardiovascular:     Rate and Rhythm: Normal rate and regular rhythm.  Pulmonary:     Effort: Pulmonary effort is normal.     Breath sounds: Normal breath sounds. No wheezing or rales.  Abdominal:     General: Bowel sounds are normal.     Palpations: Abdomen is soft.     Tenderness: There is no abdominal tenderness. There is no guarding or rebound.  Musculoskeletal:        General: Normal range of motion.     Cervical back: Normal range of motion and neck supple.  Skin:    General: Skin is warm and dry.     Capillary Refill: Capillary refill takes less than 2 seconds.     Comments: Vesicles linear on the shins and calves   Neurological:     General: No focal deficit present.     Mental Status: He is alert and oriented  to person, place, and time.  Psychiatric:        Mood and Affect: Mood normal.        Behavior: Behavior normal.     ED Results / Procedures / Treatments   Labs (all labs ordered are listed, but only abnormal results are displayed) Labs Reviewed - No data to display  EKG None  Radiology No results found.  Procedures Procedures    Medications Ordered in ED Medications  predniSONE (DELTASONE) tablet 60 mg (60 mg Oral Given 05/27/22 0602)    ED Course/ Medical Decision Making/ A&P                             Medical Decision Making Poison oak likely dermatitis   Amount and/or Complexity of Data Reviewed External Data Reviewed: notes.    Details: Previous notes reviewed   Risk Prescription drug management. Risk Details: Will start steroids orally.  Stable for discharge.   Follow up with PMD.  Strict return.      Final Clinical Impression(s) / ED Diagnoses Final diagnoses:  Dermatitis due to plants, including poison ivy, sumac, and oak   Return for intractable cough, coughing up blood, fevers > 100.4 unrelieved by medication, shortness of breath, intractable vomiting, chest pain, shortness of breath, weakness, numbness, changes in speech, facial asymmetry, abdominal pain, passing out, Inability to tolerate liquids or food, cough, altered mental status or any concerns. No signs of systemic illness or infection. The patient is nontoxic-appearing on exam and vital signs are within normal limits.  I have reviewed the triage vital signs and the nursing notes. Pertinent labs & imaging results that were available during my care of the patient were reviewed by me and considered in my medical decision making (see chart for details). After history, exam, and medical workup I feel the patient has been appropriately medically screened and is safe for discharge home. Pertinent diagnoses were discussed with the patient. Patient was given return precautions Rx / DC Orders ED Discharge Orders          Ordered    predniSONE (DELTASONE) 50 MG tablet        05/27/22 0603              Chioma Mukherjee, MD 05/27/22 1610

## 2022-06-26 ENCOUNTER — Telehealth: Payer: Self-pay | Admitting: Hematology and Oncology

## 2022-06-26 NOTE — Telephone Encounter (Signed)
Call completed. Patient is aware of their rescheduled appointment.

## 2022-08-03 ENCOUNTER — Ambulatory Visit: Payer: 59 | Admitting: Hematology and Oncology

## 2022-08-03 ENCOUNTER — Other Ambulatory Visit: Payer: 59

## 2022-08-22 ENCOUNTER — Inpatient Hospital Stay: Payer: 59 | Attending: Hematology and Oncology

## 2022-08-22 ENCOUNTER — Other Ambulatory Visit: Payer: Self-pay | Admitting: Hematology and Oncology

## 2022-08-22 ENCOUNTER — Other Ambulatory Visit: Payer: Self-pay

## 2022-08-22 ENCOUNTER — Inpatient Hospital Stay: Payer: 59 | Admitting: Hematology and Oncology

## 2022-08-22 VITALS — BP 121/79 | HR 73 | Temp 98.1°F | Resp 14 | Wt 205.6 lb

## 2022-08-22 DIAGNOSIS — Z7901 Long term (current) use of anticoagulants: Secondary | ICD-10-CM | POA: Diagnosis not present

## 2022-08-22 DIAGNOSIS — I2699 Other pulmonary embolism without acute cor pulmonale: Secondary | ICD-10-CM | POA: Diagnosis not present

## 2022-08-22 DIAGNOSIS — Z86711 Personal history of pulmonary embolism: Secondary | ICD-10-CM | POA: Insufficient documentation

## 2022-08-22 LAB — CMP (CANCER CENTER ONLY)
ALT: 25 U/L (ref 0–44)
AST: 19 U/L (ref 15–41)
Albumin: 4.6 g/dL (ref 3.5–5.0)
Alkaline Phosphatase: 105 U/L (ref 38–126)
Anion gap: 6 (ref 5–15)
BUN: 17 mg/dL (ref 6–20)
CO2: 28 mmol/L (ref 22–32)
Calcium: 9.7 mg/dL (ref 8.9–10.3)
Chloride: 104 mmol/L (ref 98–111)
Creatinine: 0.77 mg/dL (ref 0.61–1.24)
GFR, Estimated: >60 mL/min (ref >60)
Glucose, Bld: 74 mg/dL (ref 70–99)
Potassium: 3.7 mmol/L (ref 3.5–5.1)
Sodium: 138 mmol/L (ref 135–145)
Total Bilirubin: 0.5 mg/dL (ref 0.3–1.2)
Total Protein: 7.3 g/dL (ref 6.5–8.1)

## 2022-08-22 LAB — CBC WITH DIFFERENTIAL (CANCER CENTER ONLY)
Abs Immature Granulocytes: 0.07 10*3/uL (ref 0.00–0.07)
Basophils Absolute: 0 10*3/uL (ref 0.0–0.1)
Basophils Relative: 0 %
Eosinophils Absolute: 0 10*3/uL (ref 0.0–0.5)
Eosinophils Relative: 0 %
HCT: 41.6 % (ref 39.0–52.0)
Hemoglobin: 14.9 g/dL (ref 13.0–17.0)
Immature Granulocytes: 1 %
Lymphocytes Relative: 35 %
Lymphs Abs: 2.4 10*3/uL (ref 0.7–4.0)
MCH: 30.8 pg (ref 26.0–34.0)
MCHC: 35.8 g/dL (ref 30.0–36.0)
MCV: 86 fL (ref 80.0–100.0)
Monocytes Absolute: 0.7 10*3/uL (ref 0.1–1.0)
Monocytes Relative: 10 %
Neutro Abs: 3.5 10*3/uL (ref 1.7–7.7)
Neutrophils Relative %: 54 %
Platelet Count: 296 10*3/uL (ref 150–400)
RBC: 4.84 MIL/uL (ref 4.22–5.81)
RDW: 12.6 % (ref 11.5–15.5)
WBC Count: 6.7 10*3/uL (ref 4.0–10.5)
nRBC: 0 % (ref 0.0–0.2)

## 2022-08-22 NOTE — Progress Notes (Signed)
[facility] [phone]   [phone]  PROGRESS NOTE  Patient Care Team: Olive Bass, MD as PCP - General (Family Medicine)  Hematological/Oncological History # Unprovoked Pulmonary Embolism 1) 07/17/2021-07/20/2021: Admitted for subsegmental pulmonary emboli. He was treated with IV heparin. Doppler US was negative for DVT. Additionally, CT imaging showed RLL infiltrate concerning for pneumonia and/or pneumonitis. Patient was treated on CTX, Azithromycin and robaxin. He was discharged on Augmentin and Azithromycin x 3 days along with eliquis for PE.  2) 07/31/2021: Establish care with [facility] Hematology  Interval History:  Ronnie Middleton 42 y.o. male with medical history significant for unprovoked PE who presents for a follow up visit. The patient's last visit was on 02/02/2022. In the interim since the last visit he has had no major changes in his health.  On exam today Mr. Radin reports he has been well overall in the interim since her last visit.  He continues on Eliquis 5 mg twice daily.  He notes the cost of the medication is $10 per month.  He is not having any bleeding, bruising, or dark stools.  He reports he is not having any signs or symptoms concerning for new blood clot such as leg swelling, leg pain, chest pain, or shortness of breath.  He had no other major changes in his health in the interim since her last visit.  He was however exposed to some poison ivy or oak and had to be on prednisone at that time.  He thinks he may be contracted this while doing yard work and mostly affected his legs and various other places on his body.  He notes his energy is good and his appetite is strong.  He otherwise denies any fevers, chills, sweats, nausea, vomiting or diarrhea.  A full 10 point ROS was otherwise negative.  Overall he is willing and able to continue with anticoagulation therapy at this time.  MEDICAL HISTORY:  Past Medical History:  Diagnosis Date    COVID 2022   Mononucleosis    Strep throat     SURGICAL HISTORY: Past Surgical History:  Procedure Laterality Date   WISDOM TOOTH EXTRACTION      SOCIAL HISTORY: Social History   Socioeconomic History   Marital status: Married    Spouse name: Not on file   Number of children: 1   Years of education: Not on file   Highest education level: Not on file  Occupational History   Not on file  Tobacco Use   Smoking status: Never    Passive exposure: Never   Smokeless tobacco: Never  Vaping Use   Vaping status: Never Used  Substance and Sexual Activity   Alcohol use: Not Currently   Drug use: Not Currently   Sexual activity: Yes  Other Topics Concern   Not on file  Social History Narrative   Not on file   Social Determinants of Health   Financial Resource Strain: Not on file  Food Insecurity: Not on file  Transportation Needs: Not on file  Physical Activity: Not on file  Stress: Not on file  Social Connections: Not on file  Intimate Partner Violence: Not on file    FAMILY HISTORY: Family History  Problem Relation Age of Onset   Anxiety disorder Mother    Hyperlipidemia Father    Prostate cancer Maternal Grandfather    Colon cancer Neg Hx    Rectal cancer Neg Hx    Stomach cancer Neg Hx    Esophageal  cancer Neg Hx     ALLERGIES:  has No Known Allergies.  MEDICATIONS:  Current Outpatient Medications  Medication Sig Dispense Refill   acetaminophen (TYLENOL) 325 MG tablet Take 2 tablets (650 mg total) by mouth every 6 (six) hours as needed for mild pain, headache or fever.     apixaban (ELIQUIS) 2.5 MG TABS tablet Take 1 tablet (2.5 mg total) by mouth 2 (two) times daily. 180 tablet 2   omeprazole (PRILOSEC OTC) 20 MG tablet Take 1 tablet by mouth daily.     predniSONE (DELTASONE) 50 MG tablet 1 tablet po QAM 5 tablet 0   No current facility-administered medications for this visit.    REVIEW OF SYSTEMS:   Constitutional: ( - ) fevers, ( - )  chills , ( - )  night sweats Eyes: ( - ) blurriness of vision, ( - ) double vision, ( - ) watery eyes Ears, nose, mouth, throat, and face: ( - ) mucositis, ( - ) sore throat Respiratory: ( - ) cough, ( - ) dyspnea, ( - ) wheezes Cardiovascular: ( - ) palpitation, ( - ) chest discomfort, ( - ) lower extremity swelling Gastrointestinal:  ( - ) nausea, ( - ) heartburn, ( - ) change in bowel habits Skin: ( - ) abnormal skin rashes Lymphatics: ( - ) new lymphadenopathy, ( - ) easy bruising Neurological: ( - ) numbness, ( - ) tingling, ( - ) new weaknesses Behavioral/Psych: ( - ) mood change, ( - ) new changes  All other systems were reviewed with the patient and are negative.  PHYSICAL EXAMINATION:  Vitals:   08/22/22 1506  BP: 121/79  Pulse: 73  Resp: 14  Temp: 98.1 F (36.7 C)  SpO2: 99%    Filed Weights   08/22/22 1506  Weight: 205 lb 9.6 oz (93.3 kg)     GENERAL: well appearing middle aged Caucasian male alert, no distress and comfortable SKIN: skin color, texture, turgor are normal, no rashes or significant lesions EYES: conjunctiva are pink and non-injected, sclera clear LUNGS: clear to auscultation and percussion with normal breathing effort HEART: regular rate & rhythm and no murmurs and no lower extremity edema Musculoskeletal: no cyanosis of digits and no clubbing  PSYCH: alert & oriented x 3, fluent speech NEURO: no focal motor/sensory deficits  LABORATORY DATA:  I have reviewed the data as listed    Latest Ref Rng & Units 08/22/2022    2:42 PM 02/02/2022    1:38 PM 08/21/2021    3:12 PM  CBC  WBC 4.0 - 10.5 K/uL 6.7  6.7  5.6   Hemoglobin 13.0 - 17.0 g/dL 40.9  81.1  91.4   Hematocrit 39.0 - 52.0 % 41.6  42.5  43.6   Platelets 150 - 400 K/uL 296  321  260        Latest Ref Rng & Units 08/22/2022    2:42 PM 02/02/2022    1:38 PM 08/21/2021    3:12 PM  CMP  Glucose 70 - 99 mg/dL 74  92  782   BUN 6 - 20 mg/dL 17  18  18    Creatinine 0.61 - 1.24 mg/dL 9.56  2.13  0.86    Sodium 135 - 145 mmol/L 138  140  139   Potassium 3.5 - 5.1 mmol/L 3.7  3.9  3.9   Chloride 98 - 111 mmol/L 104  106  103   CO2 22 - 32 mmol/L 28  31  25  Calcium 8.9 - 10.3 mg/dL 9.7  9.5  9.4   Total Protein 6.5 - 8.1 g/dL 7.3  6.9  7.6   Total Bilirubin 0.3 - 1.2 mg/dL 0.5  0.5  0.4   Alkaline Phos 38 - 126 U/L 105  136    AST 15 - 41 U/L 19  33  40   ALT 0 - 44 U/L 25  57  70     RADIOGRAPHIC STUDIES: No results found.  ASSESSMENT & PLAN Ronnie Middleton 42 y.o. male with medical history significant for unprovoked PE who presents for a follow up visit.  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 It is well tolerated. No difficulties accessing/affording the medication --RTC in 6 months' time with strict return precautions for overt signs of bleeding.   No orders of the defined types were placed in this encounter.   All questions were answered. The patient knows to call the clinic with any problems, questions or concerns.  A total of more than 25 minutes were spent on this encounter with face-to-face time and non-face-to-face time, including preparing to see the patient, ordering tests and/or medications, counseling the patient and coordination of care as outlined above.   Ulysees Barns, MD Department of Hematology/Oncology [facility] Phone: [phone] Pager: [phone] Email: [email or web address]  08/22/2022 4:16 PM

## 2023-02-21 ENCOUNTER — Telehealth: Payer: Self-pay | Admitting: Internal Medicine

## 2023-02-21 NOTE — Telephone Encounter (Signed)
Rescheduled appointments per 1/22 voicemail. Patient is aware of the changes made and is active on MyChart.

## 2023-02-22 ENCOUNTER — Inpatient Hospital Stay: Payer: 59

## 2023-02-22 ENCOUNTER — Other Ambulatory Visit: Payer: Self-pay | Admitting: Hematology and Oncology

## 2023-02-22 ENCOUNTER — Ambulatory Visit: Payer: 59 | Admitting: Hematology and Oncology

## 2023-02-22 ENCOUNTER — Other Ambulatory Visit: Payer: 59

## 2023-02-22 ENCOUNTER — Inpatient Hospital Stay: Payer: 59 | Admitting: Physician Assistant

## 2023-02-22 DIAGNOSIS — I2699 Other pulmonary embolism without acute cor pulmonale: Secondary | ICD-10-CM

## 2023-03-28 ENCOUNTER — Telehealth: Payer: Self-pay | Admitting: Physician Assistant

## 2023-03-29 ENCOUNTER — Inpatient Hospital Stay: Payer: 59 | Admitting: Physician Assistant

## 2023-03-29 ENCOUNTER — Inpatient Hospital Stay: Payer: 59

## 2023-04-11 ENCOUNTER — Inpatient Hospital Stay: Payer: 59

## 2023-04-11 ENCOUNTER — Inpatient Hospital Stay: Payer: 59 | Admitting: Hematology and Oncology

## 2023-04-11 NOTE — Progress Notes (Signed)
 Rescheduled

## 2023-04-15 ENCOUNTER — Telehealth: Payer: Self-pay | Admitting: Hematology and Oncology

## 2023-04-19 ENCOUNTER — Inpatient Hospital Stay: Attending: Hematology and Oncology

## 2023-04-19 ENCOUNTER — Inpatient Hospital Stay (HOSPITAL_BASED_OUTPATIENT_CLINIC_OR_DEPARTMENT_OTHER): Admitting: Hematology and Oncology

## 2023-04-19 VITALS — BP 117/73 | HR 71 | Temp 97.9°F | Resp 13 | Wt 184.5 lb

## 2023-04-19 DIAGNOSIS — Z7982 Long term (current) use of aspirin: Secondary | ICD-10-CM | POA: Insufficient documentation

## 2023-04-19 DIAGNOSIS — I2699 Other pulmonary embolism without acute cor pulmonale: Secondary | ICD-10-CM

## 2023-04-19 DIAGNOSIS — Z86711 Personal history of pulmonary embolism: Secondary | ICD-10-CM | POA: Diagnosis present

## 2023-04-19 LAB — CMP (CANCER CENTER ONLY)
ALT: 34 U/L (ref 0–44)
AST: 20 U/L (ref 15–41)
Albumin: 4.7 g/dL (ref 3.5–5.0)
Alkaline Phosphatase: 90 U/L (ref 38–126)
Anion gap: 7 (ref 5–15)
BUN: 14 mg/dL (ref 6–20)
CO2: 30 mmol/L (ref 22–32)
Calcium: 9.7 mg/dL (ref 8.9–10.3)
Chloride: 103 mmol/L (ref 98–111)
Creatinine: 0.8 mg/dL (ref 0.61–1.24)
GFR, Estimated: >60 mL/min (ref >60)
Glucose, Bld: 84 mg/dL (ref 70–99)
Potassium: 3.8 mmol/L (ref 3.5–5.1)
Sodium: 140 mmol/L (ref 135–145)
Total Bilirubin: 0.7 mg/dL (ref 0.0–1.2)
Total Protein: 7.6 g/dL (ref 6.5–8.1)

## 2023-04-19 LAB — CBC WITH DIFFERENTIAL (CANCER CENTER ONLY)
Abs Immature Granulocytes: 0.02 10*3/uL (ref 0.00–0.07)
Basophils Absolute: 0 10*3/uL (ref 0.0–0.1)
Basophils Relative: 1 %
Eosinophils Absolute: 0 10*3/uL (ref 0.0–0.5)
Eosinophils Relative: 1 %
HCT: 44.6 % (ref 39.0–52.0)
Hemoglobin: 15.6 g/dL (ref 13.0–17.0)
Immature Granulocytes: 0 %
Lymphocytes Relative: 36 %
Lymphs Abs: 2.1 10*3/uL (ref 0.7–4.0)
MCH: 30.8 pg (ref 26.0–34.0)
MCHC: 35 g/dL (ref 30.0–36.0)
MCV: 88.1 fL (ref 80.0–100.0)
Monocytes Absolute: 0.4 10*3/uL (ref 0.1–1.0)
Monocytes Relative: 8 %
Neutro Abs: 3.2 10*3/uL (ref 1.7–7.7)
Neutrophils Relative %: 54 %
Platelet Count: 325 10*3/uL (ref 150–400)
RBC: 5.06 MIL/uL (ref 4.22–5.81)
RDW: 12.8 % (ref 11.5–15.5)
WBC Count: 5.8 10*3/uL (ref 4.0–10.5)
nRBC: 0 % (ref 0.0–0.2)

## 2023-04-19 NOTE — Progress Notes (Signed)
 [facility] [phone]) [phone]   [phone]) [phone]  PROGRESS NOTE  Patient Care Team: Olive Bass, MD as PCP - General (Family Medicine)  Hematological/Oncological History # Unprovoked Pulmonary Embolism 1) 07/17/2021-07/20/2021: Admitted for subsegmental pulmonary emboli. He was treated with IV heparin. Doppler US was negative for DVT. Additionally, CT imaging showed RLL infiltrate concerning for pneumonia and/or pneumonitis. Patient was treated on CTX, Azithromycin and robaxin. He was discharged on Augmentin and Azithromycin x 3 days along with eliquis for PE.  2) 07/31/2021: Establish care with [facility] Hematology  Interval History:  Ronnie Middleton 43 y.o. male with medical history significant for unprovoked PE who presents for a follow up visit. The patient's last visit was on 08/22/2022. In the interim since the last visit he has had no major changes in his health.  On exam today Ronnie Middleton reports he has been off Eliquis for the last 4 months.  He reports that he ran out and became dizzy when his daughter was born 8 weeks ago.  He reports he does not like taking medication and decided not to get it refilled.  He reports that he is open to considering baby aspirin daily.  He notes he has lost about 40 pounds since July 2024.  He also reports the cost of the medication was going to get high, going from $10 a month to $90 a month.  He reports he cut sugar out of his drinks and is doing his best with diet and lose weight.  He said no other recent changes in his health.. He otherwise denies any fevers, chills, sweats, nausea, vomiting or diarrhea.  A full 10 point ROS was otherwise negative.  Overall he is willing and able to continue with anticoagulation therapy at this time.  MEDICAL HISTORY:  Past Medical History:  Diagnosis Date   COVID 2022   Mononucleosis    Strep throat     SURGICAL HISTORY: Past Surgical History:  Procedure Laterality Date   WISDOM TOOTH  EXTRACTION      SOCIAL HISTORY: Social History   Socioeconomic History   Marital status: Married    Spouse name: Not on file   Number of children: 1   Years of education: Not on file   Highest education level: Not on file  Occupational History   Not on file  Tobacco Use   Smoking status: Never    Passive exposure: Never   Smokeless tobacco: Never  Vaping Use   Vaping status: Never Used  Substance and Sexual Activity   Alcohol use: Not Currently   Drug use: Not Currently   Sexual activity: Yes  Other Topics Concern   Not on file  Social History Narrative   Not on file   Social Drivers of Health   Financial Resource Strain: Not on file  Food Insecurity: Not on file  Transportation Needs: Not on file  Physical Activity: Not on file  Stress: Not on file  Social Connections: Not on file  Intimate Partner Violence: Not on file    FAMILY HISTORY: Family History  Problem Relation Age of Onset   Anxiety disorder Mother    Hyperlipidemia Father    Prostate cancer Maternal Grandfather    Colon cancer Neg Hx    Rectal cancer Neg Hx    Stomach cancer Neg Hx    Esophageal cancer Neg Hx     ALLERGIES:  has no known allergies.  MEDICATIONS:  Current Outpatient Medications  Medication Sig Dispense Refill  acetaminophen (TYLENOL) 325 MG tablet Take 2 tablets (650 mg total) by mouth every 6 (six) hours as needed for mild pain, headache or fever.     omeprazole (PRILOSEC OTC) 20 MG tablet Take 1 tablet by mouth daily.     predniSONE (DELTASONE) 50 MG tablet 1 tablet po QAM 5 tablet 0   No current facility-administered medications for this visit.    REVIEW OF SYSTEMS:   Constitutional: ( - ) fevers, ( - )  chills , ( - ) night sweats Eyes: ( - ) blurriness of vision, ( - ) double vision, ( - ) watery eyes Ears, nose, mouth, throat, and face: ( - ) mucositis, ( - ) sore throat Respiratory: ( - ) cough, ( - ) dyspnea, ( - ) wheezes Cardiovascular: ( - ) palpitation, ( -  ) chest discomfort, ( - ) lower extremity swelling Gastrointestinal:  ( - ) nausea, ( - ) heartburn, ( - ) change in bowel habits Skin: ( - ) abnormal skin rashes Lymphatics: ( - ) new lymphadenopathy, ( - ) easy bruising Neurological: ( - ) numbness, ( - ) tingling, ( - ) new weaknesses Behavioral/Psych: ( - ) mood change, ( - ) new changes  All other systems were reviewed with the patient and are negative.  PHYSICAL EXAMINATION:  Vitals:   04/19/23 1135  BP: 117/73  Pulse: 71  Resp: 13  Temp: 97.9 F (36.6 C)  SpO2: 100%     Filed Weights   04/19/23 1135  Weight: 184 lb 8 oz (83.7 kg)      GENERAL: well appearing middle aged Caucasian male alert, no distress and comfortable SKIN: skin color, texture, turgor are normal, no rashes or significant lesions EYES: conjunctiva are pink and non-injected, sclera clear LUNGS: clear to auscultation and percussion with normal breathing effort HEART: regular rate & rhythm and no murmurs and no lower extremity edema Musculoskeletal: no cyanosis of digits and no clubbing  PSYCH: alert & oriented x 3, fluent speech NEURO: no focal motor/sensory deficits  LABORATORY DATA:  I have reviewed the data as listed    Latest Ref Rng & Units 04/19/2023   11:21 AM 08/22/2022    2:42 PM 02/02/2022    1:38 PM  CBC  WBC 4.0 - 10.5 K/uL 5.8  6.7  6.7   Hemoglobin 13.0 - 17.0 g/dL 09.8  11.9  14.7   Hematocrit 39.0 - 52.0 % 44.6  41.6  42.5   Platelets 150 - 400 K/uL 325  296  321        Latest Ref Rng & Units 04/19/2023   11:21 AM 08/22/2022    2:42 PM 02/02/2022    1:38 PM  CMP  Glucose 70 - 99 mg/dL 84  74  92   BUN 6 - 20 mg/dL 14  17  18    Creatinine 0.61 - 1.24 mg/dL 8.29  5.62  1.30   Sodium 135 - 145 mmol/L 140  138  140   Potassium 3.5 - 5.1 mmol/L 3.8  3.7  3.9   Chloride 98 - 111 mmol/L 103  104  106   CO2 22 - 32 mmol/L 30  28  31    Calcium 8.9 - 10.3 mg/dL 9.7  9.7  9.5   Total Protein 6.5 - 8.1 g/dL 7.6  7.3  6.9   Total  Bilirubin 0.0 - 1.2 mg/dL 0.7  0.5  0.5   Alkaline Phos 38 - 126 U/L 90  105  136   AST 15 - 41 U/L 20  19  33   ALT 0 - 44 U/L 34  25  57     RADIOGRAPHIC STUDIES: No results found.  ASSESSMENT & PLAN Ronnie Middleton 43 y.o. male with medical history significant for unprovoked PE who presents for a follow up visit.  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  We discussed options to help with the cost and discussed the risks and benefits of this choice.  He notes he would like to discontinue. --Encouraged him to start aspirin 81 mg p.o. daily if he is unwilling to continue with Eliquis 2.5 mg twice daily --patient denies any bleeding, bruising, or dark stools  --RTC if he wanted to restart Eliquis therapy or if he has any other signs or symptoms concerning for recurrent VTE.  No orders of the defined types were placed in this encounter.   All questions were answered. The patient knows to call the clinic with any problems, questions or concerns.  A total of more than 25 minutes were spent on this encounter with face-to-face time and non-face-to-face time, including preparing to see the patient, ordering tests and/or medications, counseling the patient and coordination of care as outlined above.   Ulysees Barns, MD Department of Hematology/Oncology [facility] Phone: [phone] Pager: [phone] Email: [email or web address]  04/19/2023 2:28 PM

## 2023-06-02 IMAGING — US US ABDOMEN LIMITED
1 series · 14 of 25 positions shown · non-contrast
Comparison: CT of the abdomen and pelvis 07/04/2021

CLINICAL DATA: Elevated LFTs.

EXAM:
ULTRASOUND ABDOMEN LIMITED RIGHT UPPER QUADRANT

[Series 1: us abdomen limited ruq (liver/gb) · 14 of 75 slices shown]
[im 1/75]
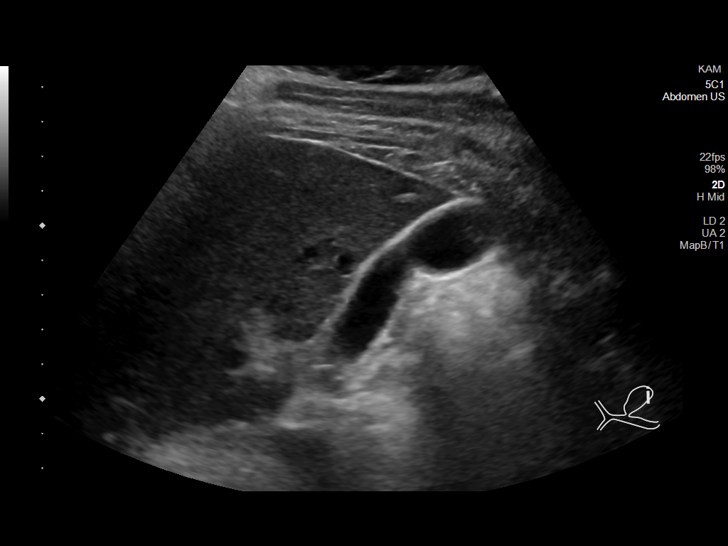
[im 7/75]
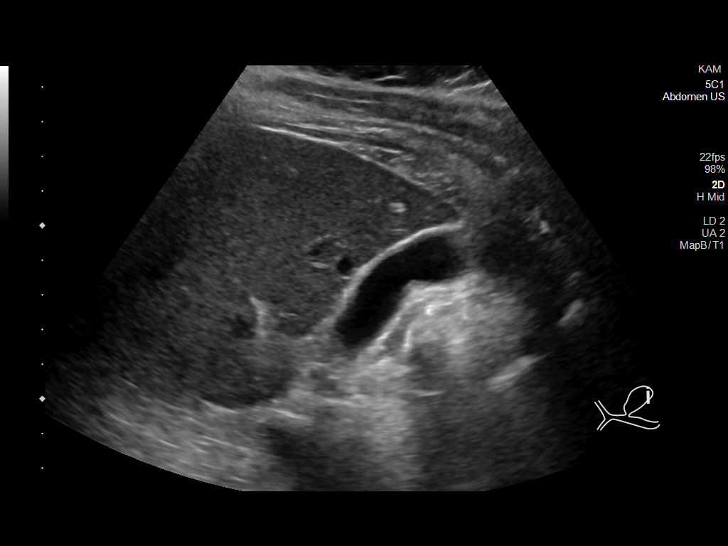
[im 13/75]
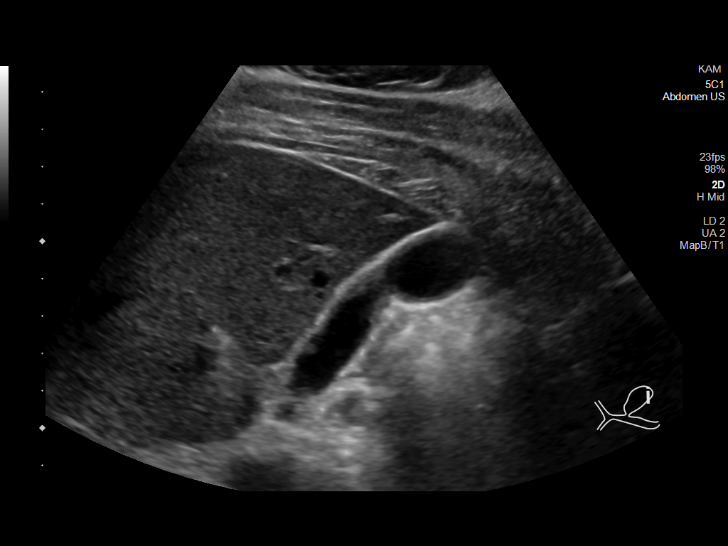
[im 19/75]
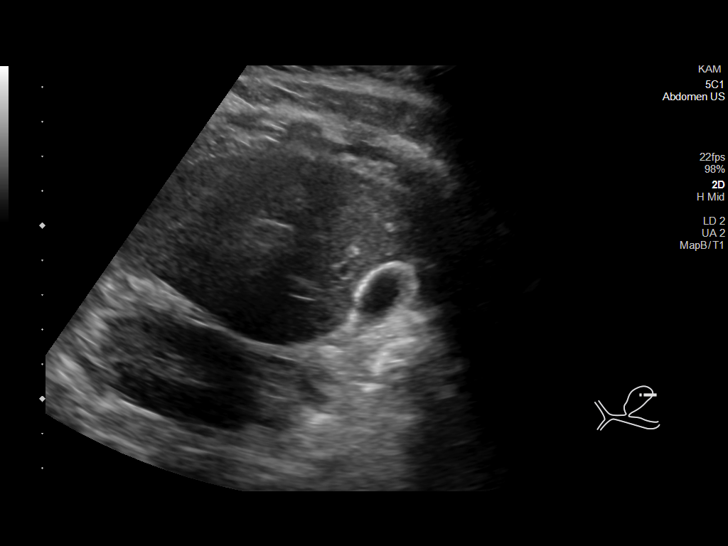
[im 25/75]
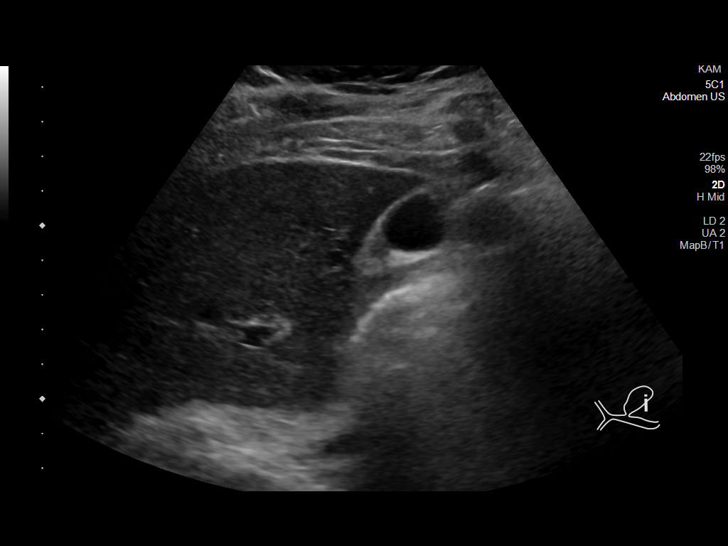
[im 28/75]
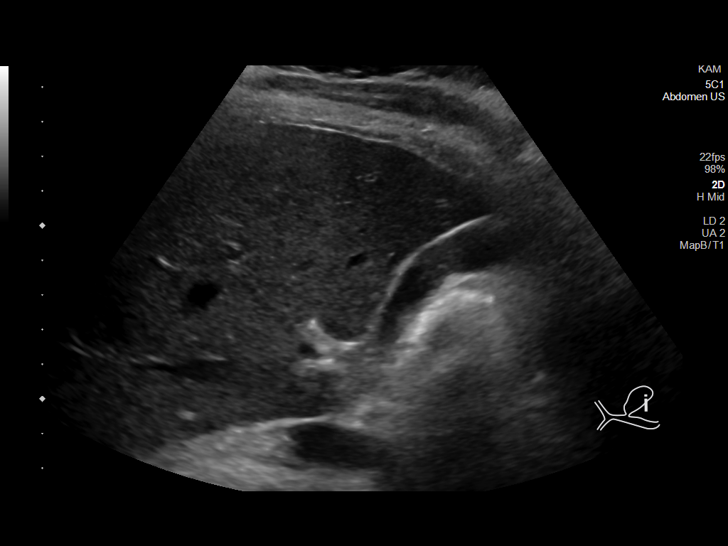
[im 34/75]
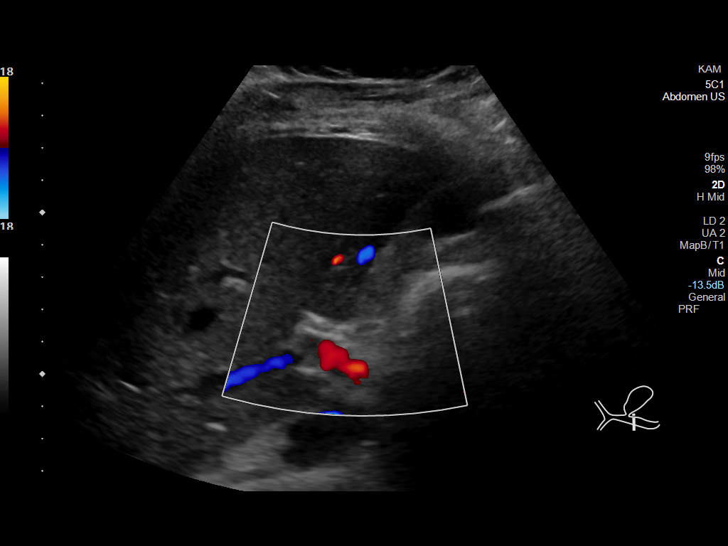
[im 41/75]
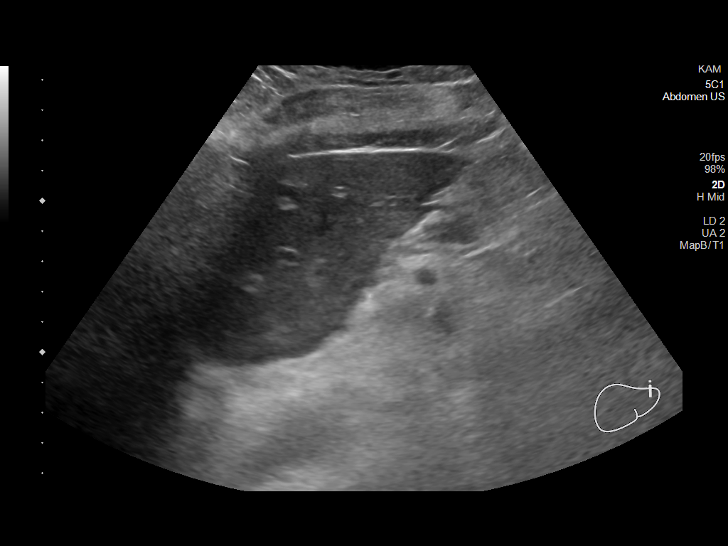
[im 47/75]
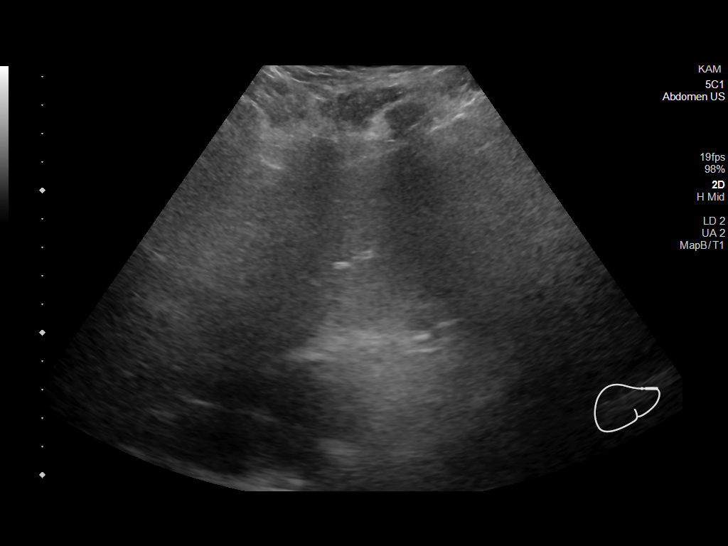
[im 50/75]
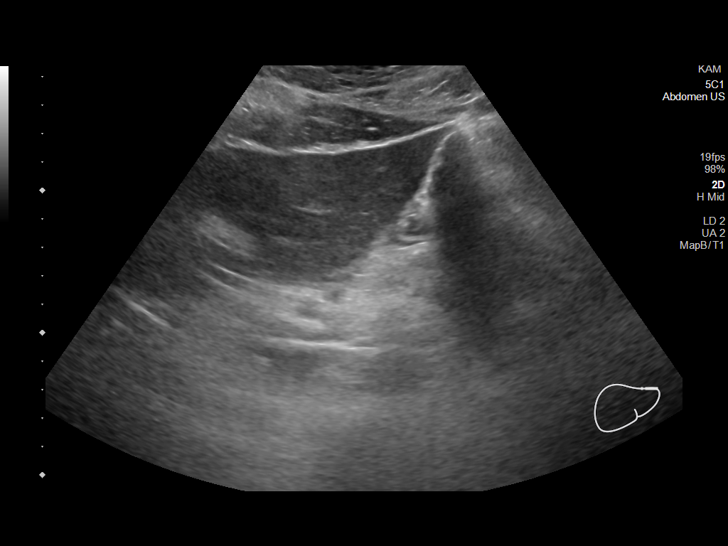
[im 56/75]
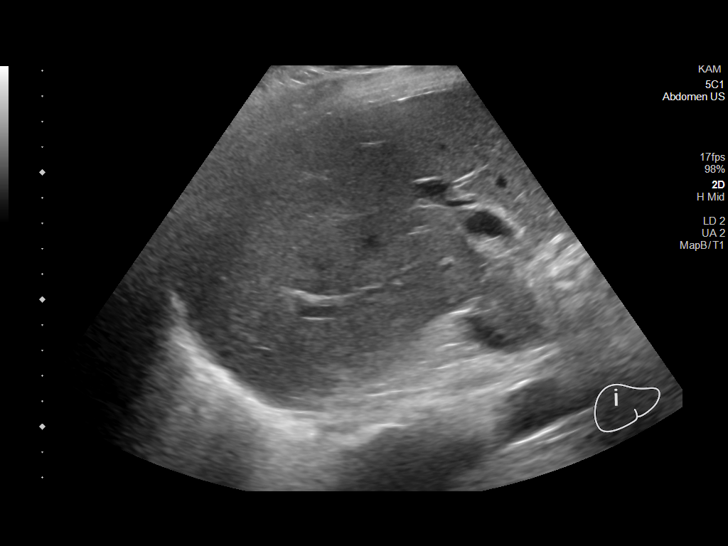
[im 62/75]
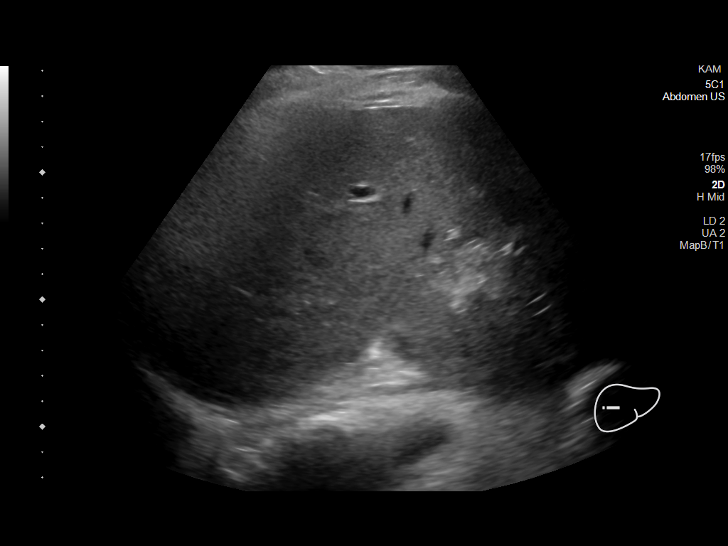
[im 68/75]
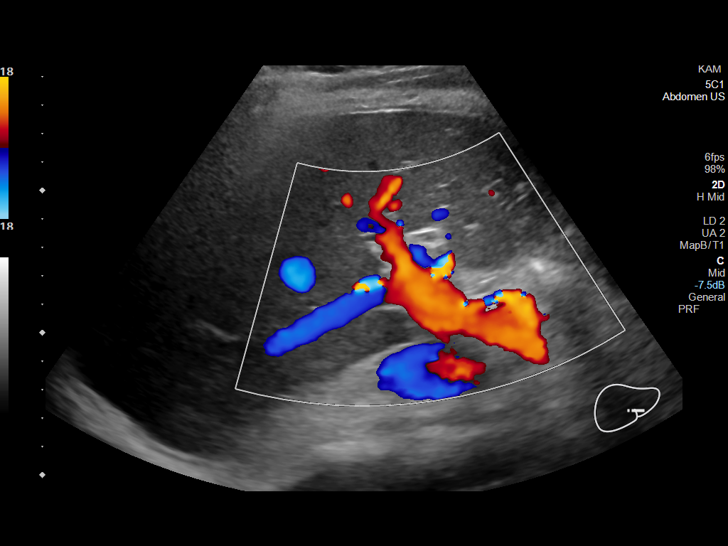
[im 75/75]
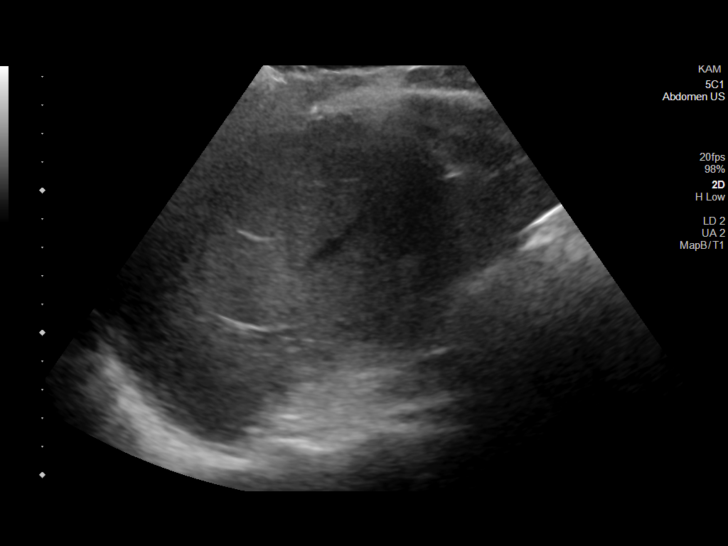

[14 of 25 positions shown; findings below may reference images not displayed]

FINDINGS: Gallbladder:

The patient admits to eating 2-3 hours prior to the exam. The
gallbladder is somewhat contracted. Wall thickness is within normal
limits. No sonographic Murphy sign is present. No stones.

Common bile duct:

Diameter: 0.6 mm, within normal limits

Liver:

No focal lesion identified. Within normal limits in parenchymal
echogenicity. Portal vein is patent on color Doppler imaging with
normal direction of blood flow towards the liver.

Other: None.
IMPRESSION: Negative right upper quadrant ultrasound.

## 2023-06-02 IMAGING — CT CT ANGIO CHEST
2 of 7 series · 17 of 46 positions shown · IV contrast (APPLIED)
Comparison: Chest x-ray earlier the same day

CLINICAL DATA: Suspected pulmonary embolism, positive D-dimer

EXAM:
CT ANGIOGRAPHY CHEST WITH CONTRAST
TECHNIQUE: Multidetector CT imaging of the chest was performed using the
standard protocol during bolus administration of intravenous
contrast. Multiplanar CT image reconstructions and MIPs were
obtained to evaluate the vascular anatomy.

[Series 6: thins · axial · 0.73mm/px · z∈[+1563,+1796]mm · 15 of 267 slices shown]
[im 17/267  lung]
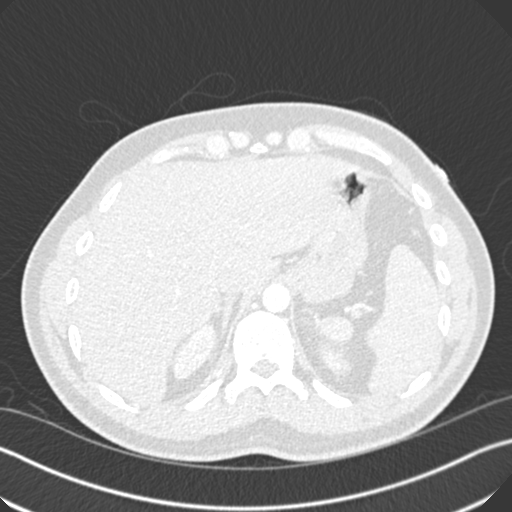
[im 34/267  soft-tissue]
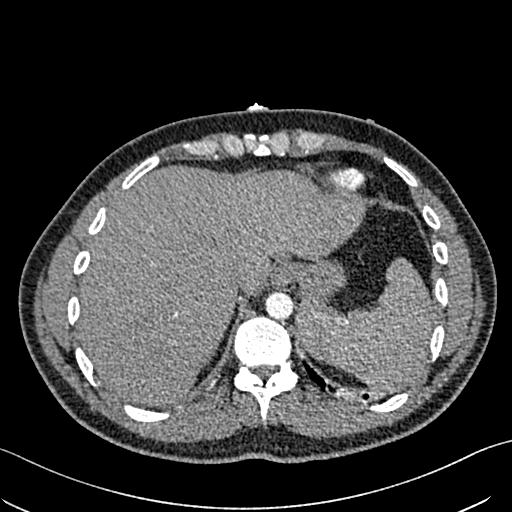
[im 50/267  lung]
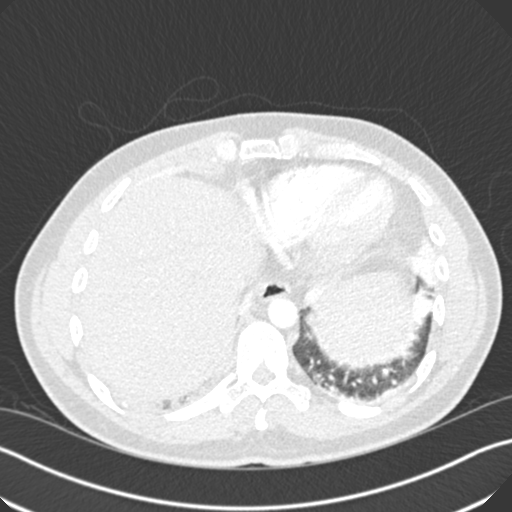
[im 67/267  soft-tissue]
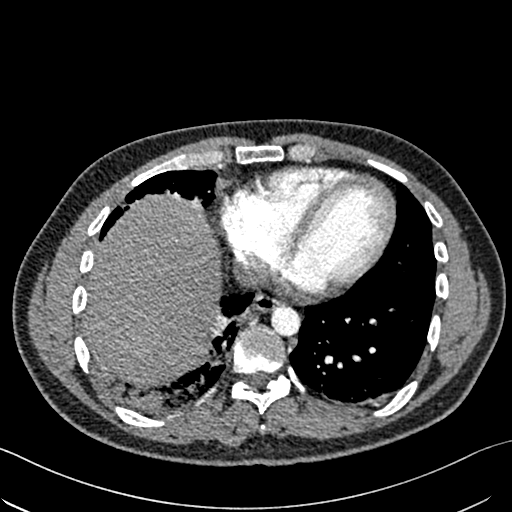
[im 84/267  lung]
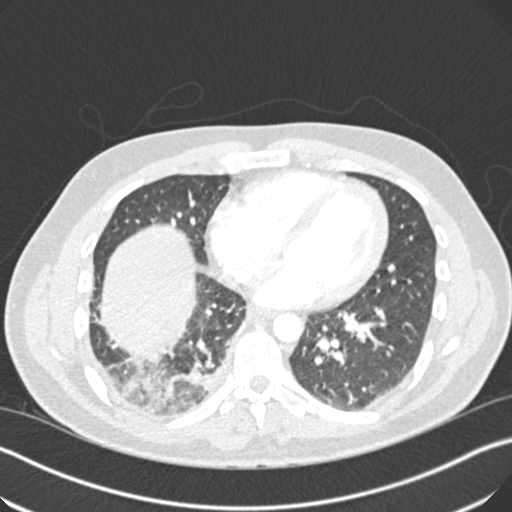
[im 100/267  soft-tissue]
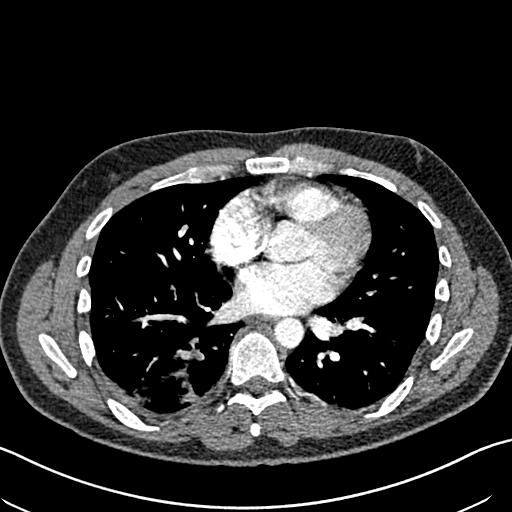
[im 117/267  lung]
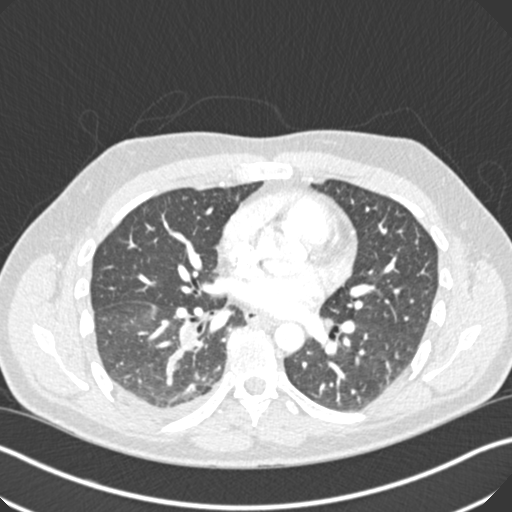
[im 134/267  soft-tissue]
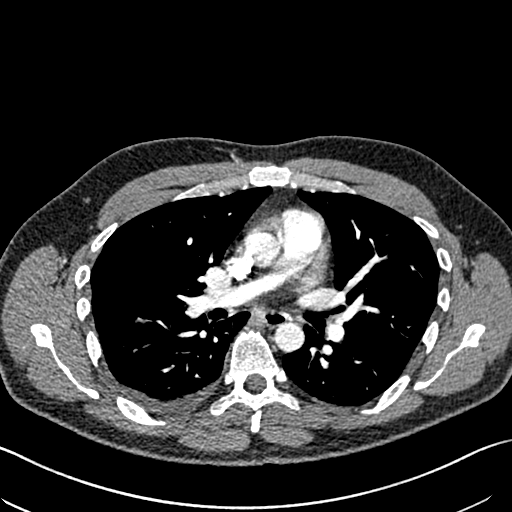
[im 150/267  lung]
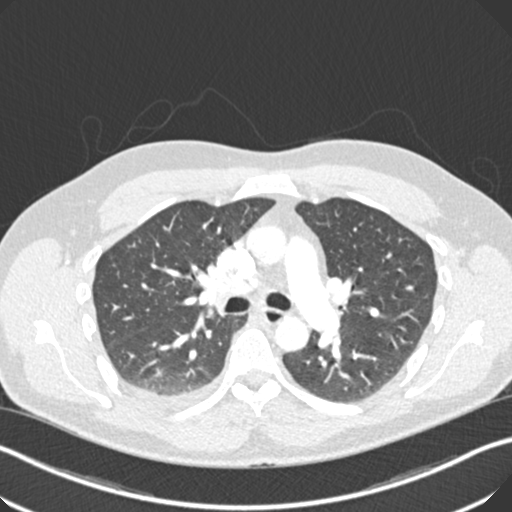
[im 167/267  soft-tissue]
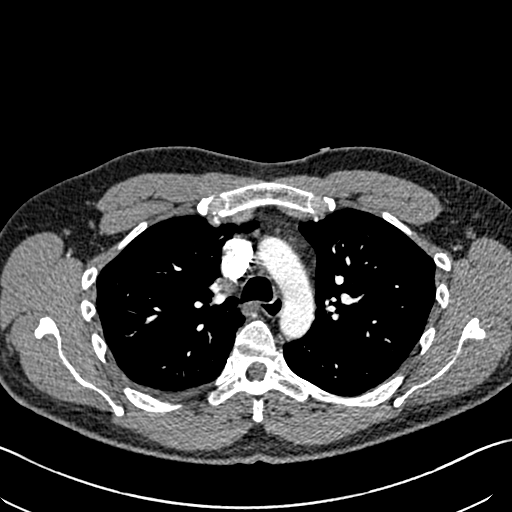
[im 183/267  lung]
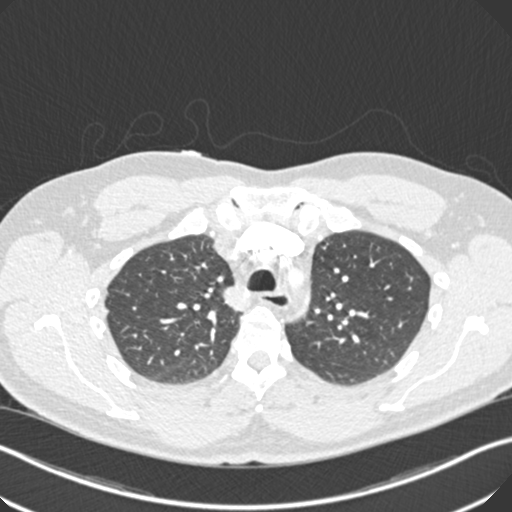
[im 200/267  soft-tissue]
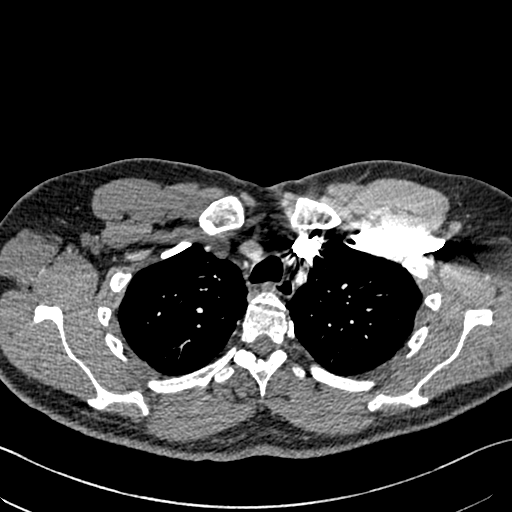
[im 217/267  lung]
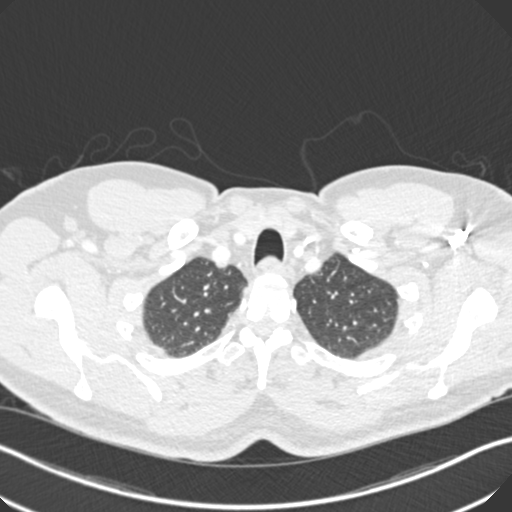
[im 233/267  soft-tissue]
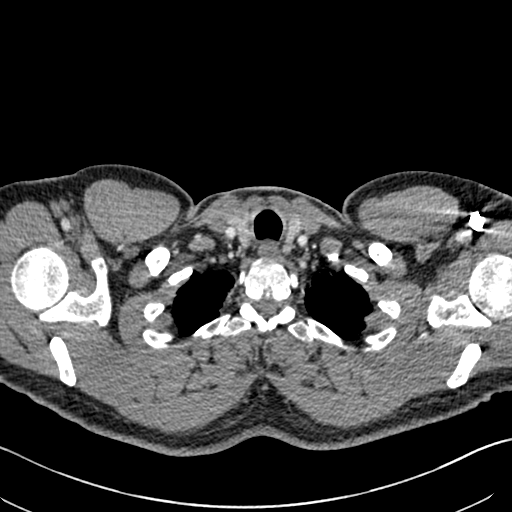
[im 250/267  lung]
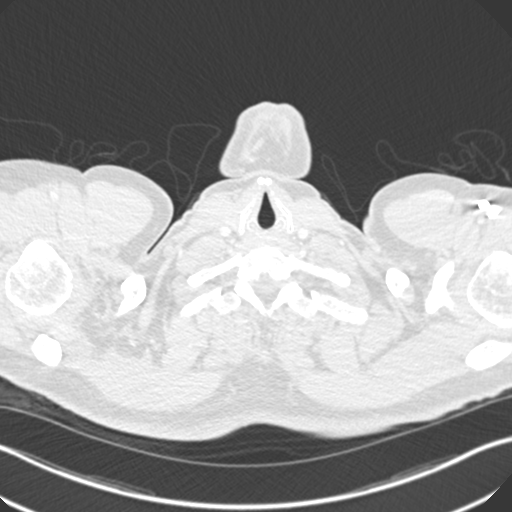

[Series 7: coronal mpr · coronal · 0.59mm/px · 2 of 88 slices shown]
[im 30/88  soft-tissue]
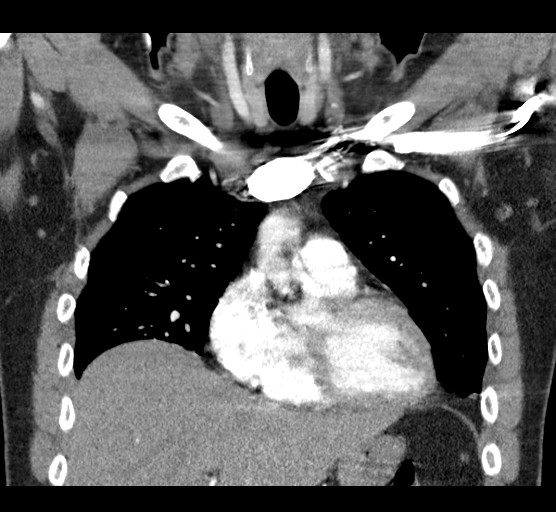
[im 59/88  soft-tissue]
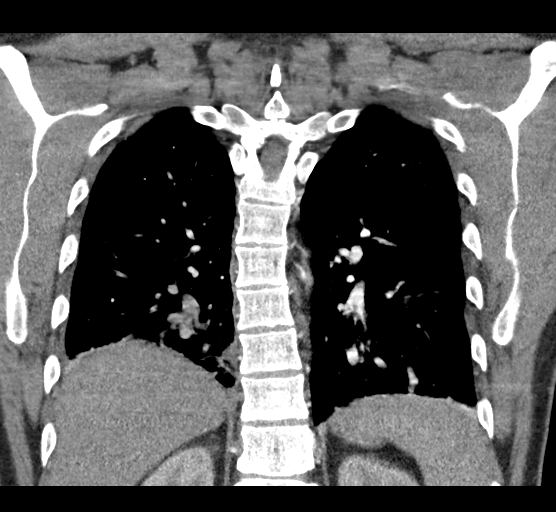

[17 of 46 positions shown; findings below may reference images not displayed]

RADIATION DOSE REDUCTION: This exam was performed according to the
departmental dose-optimization program which includes automated
exposure control, adjustment of the mA and/or kV according to
patient size and/or use of iterative reconstruction technique.

CONTRAST:  80mL OMNIPAQUE IOHEXOL 350 MG/ML SOLN
FINDINGS: Cardiovascular: Multiple pulmonary artery filling defects identified
consistent with emboli, largest in the right lower lobe proximal
segmental to subsegmental branches. Additional small emboli are
mostly subsegmental. No saddle embolism identified. Main pulmonary
artery is normal caliber. Heart size is normal. No pericardial
effusion. No evidence of right heart strain. Thoracic aorta is
normal in course and caliber.

Mediastinum/Nodes: No bulky axillary, hilar or mediastinal
lymphadenopathy identified.

Lungs/Pleura: Irregular ground-glass airspace densities the basilar
and dependent aspect of the right lower lobe with adjacent
subsegmental atelectatic changes. Small right pleural effusion. No
pneumothorax.

Upper Abdomen: Tiny hiatal hernia.  No acute process identified.

Musculoskeletal: No chest wall abnormality. No acute or significant
osseous findings.

Review of the MIP images confirms the above findings.
IMPRESSION: 1. Positive for pulmonary emboli, most prominent in the right lower
lobe as described. No saddle embolism or evidence of right heart
strain.
2. Airspace densities at the base of the right lower lobe which may
represent atelectasis and infiltrate.
3. Small right pleural effusion.

Findings discussed with Dr. Joshjax over the telephone at [DATE] p.m.
on 07/17/2021 with read back.

## 2023-06-02 IMAGING — DX DG CHEST 2V
2 series · 2 of 2 positions shown · non-contrast
Comparison: Chest radiograph dated July 04, 2021

CLINICAL DATA: Hemoptysis.

EXAM:
CHEST - 2 VIEW

[chest pa]
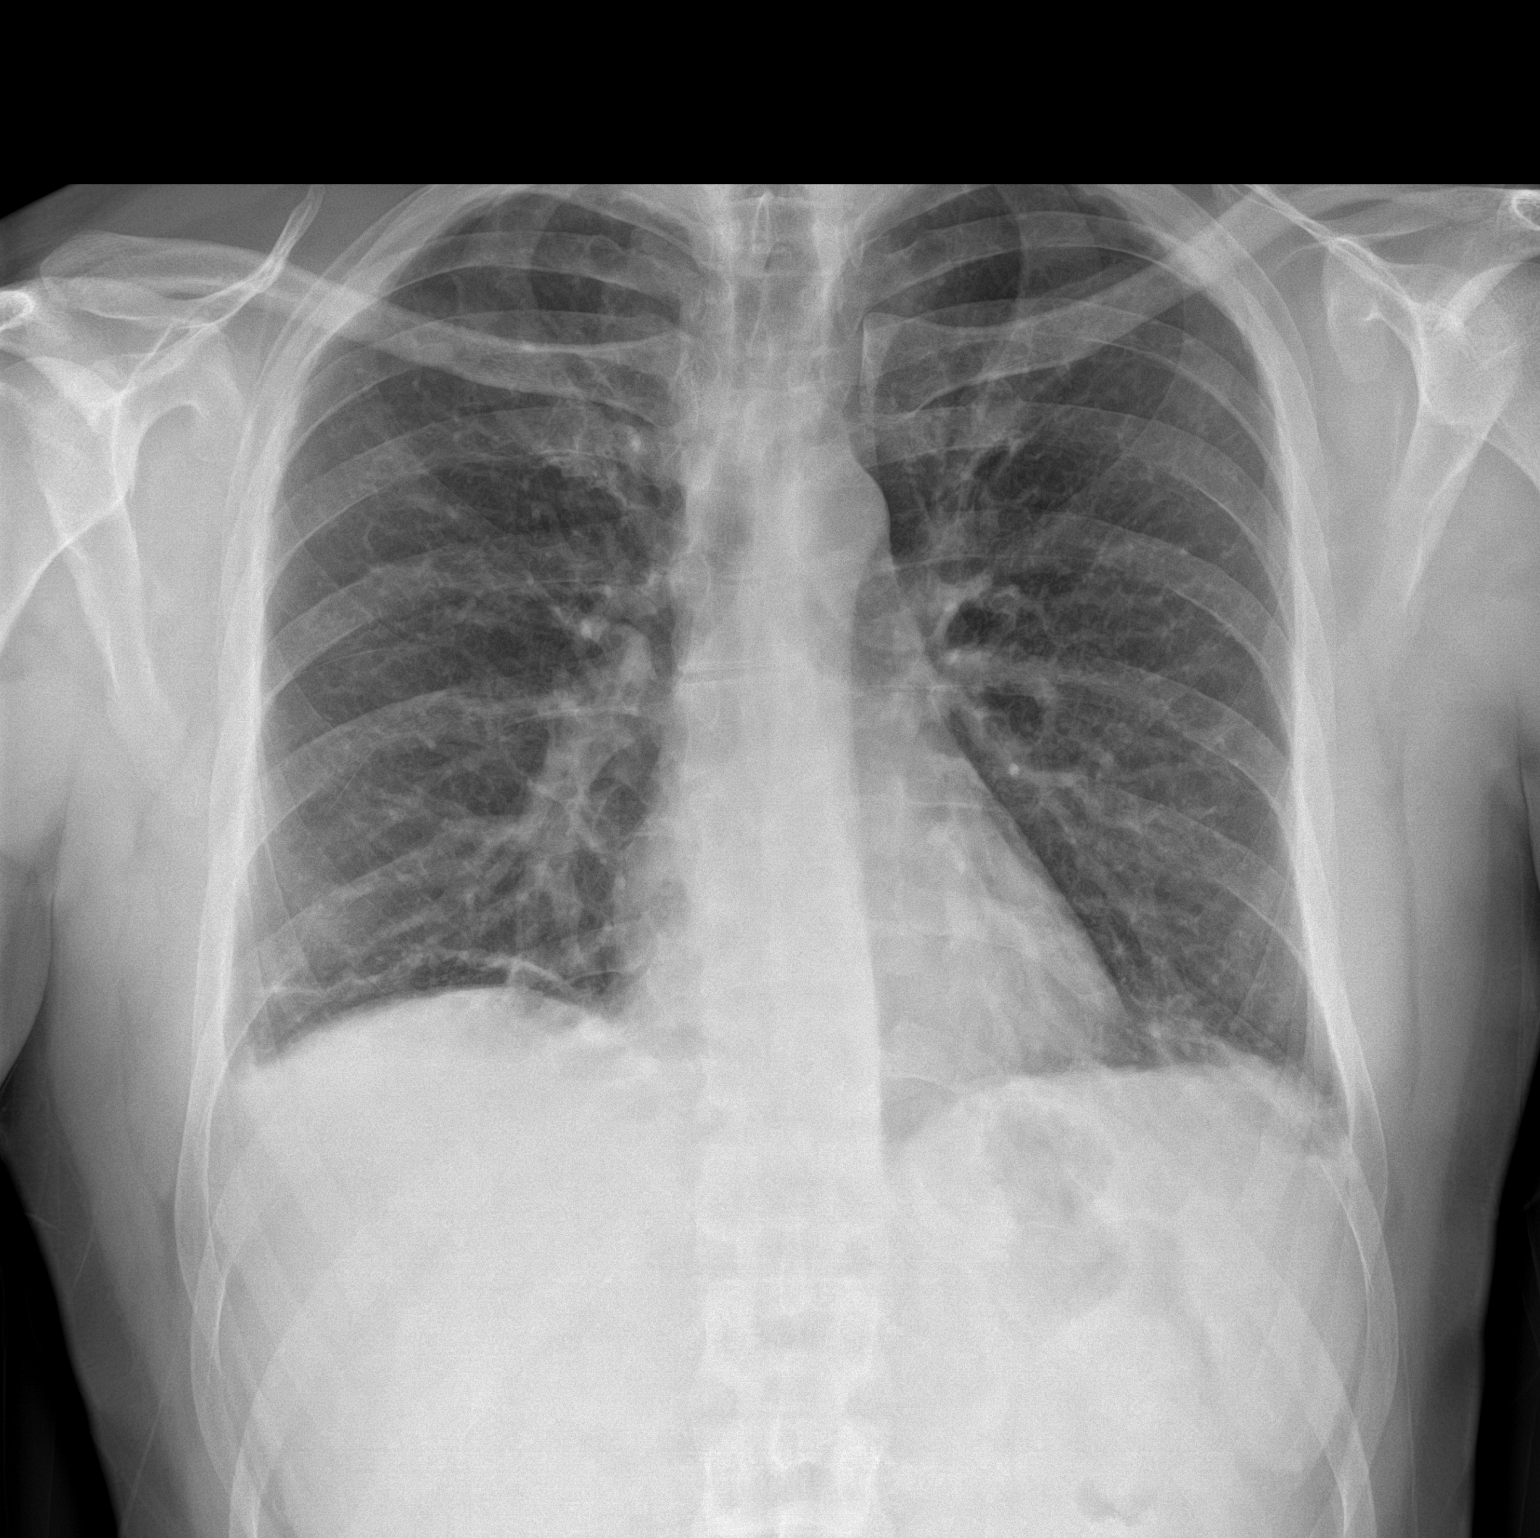

[chest lat]
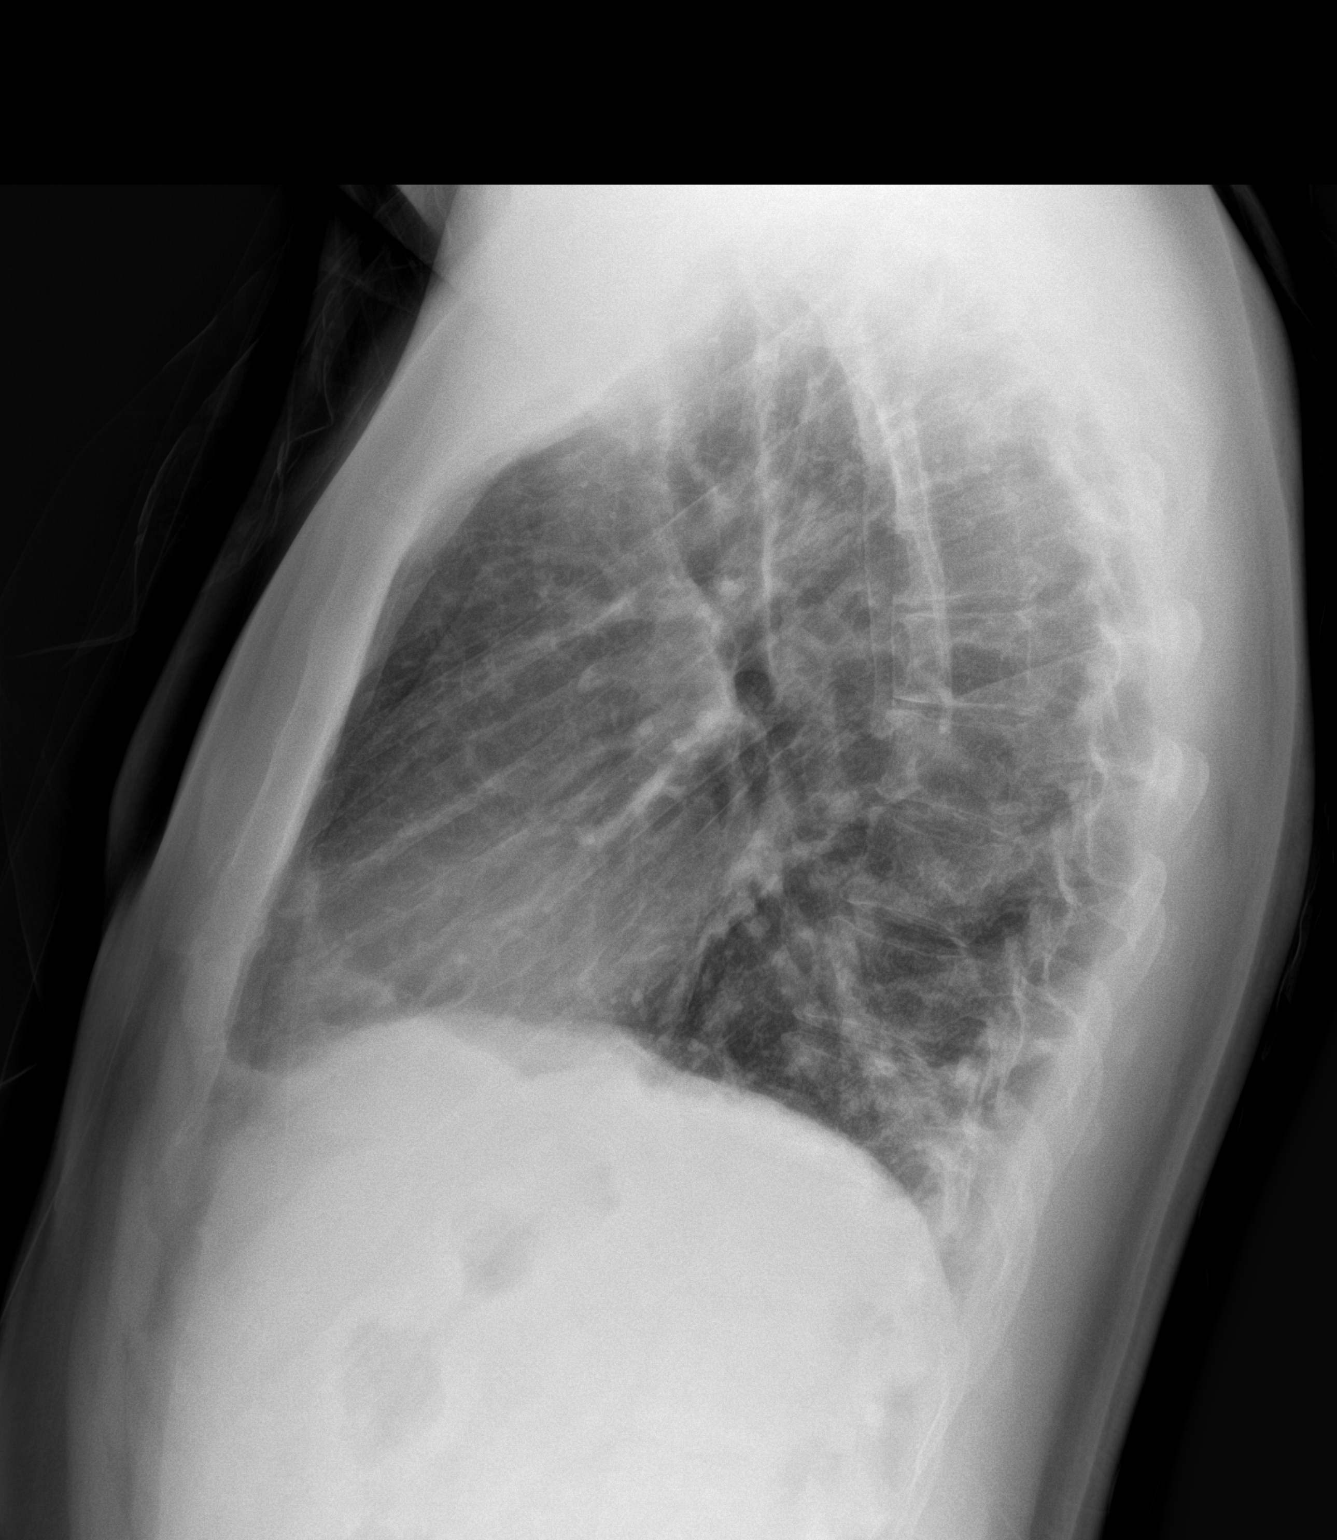

[2 of 2 positions shown; findings below may reference images not displayed]

FINDINGS: The heart size and mediastinal contours are within normal limits.
Bibasilar atelectasis. No focal consolidation or large pleural
effusion. The visualized skeletal structures are unremarkable.
IMPRESSION: Bibasilar atelectasis, no evidence of pneumonia or large pleural
effusion.

## 2023-06-04 IMAGING — CT CT RENAL STONE PROTOCOL
2 of 3 series · 16 of 46 positions shown, 18 images · non-contrast
Comparison: 07/04/2021

CLINICAL DATA: RIGHT-side back and flank pain for 5-6 days,
low-grade fever, question kidney stone



[Series 4: lung bases · axial · 0.77mm/px · z∈[-117,+1]mm · 13 of 69 slices shown, 15 images]
[im 5/69  soft-tissue]
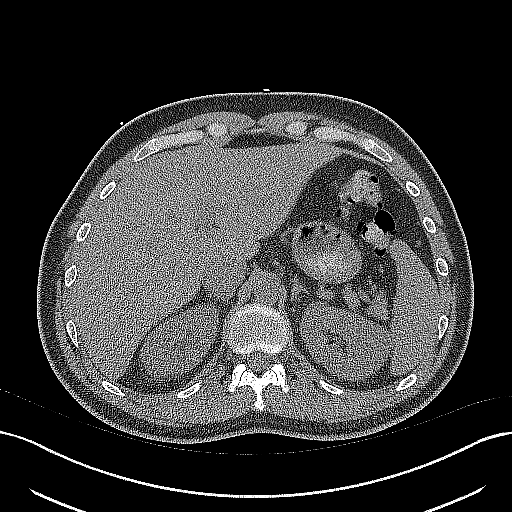
[im 5/69  bone]
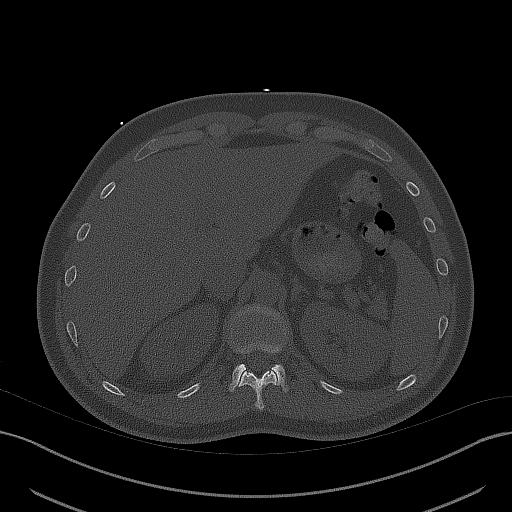
[im 9/69  soft-tissue]
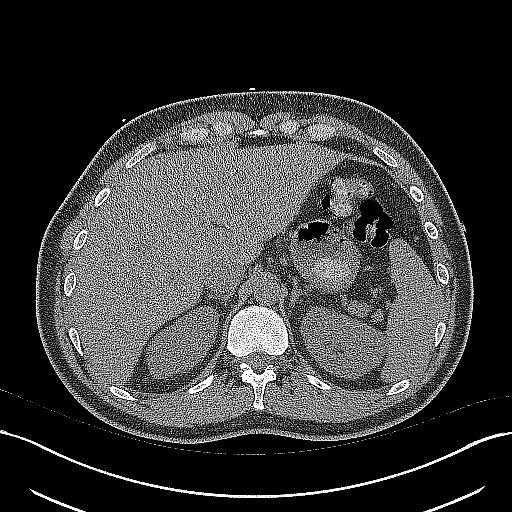
[im 14/69  soft-tissue]
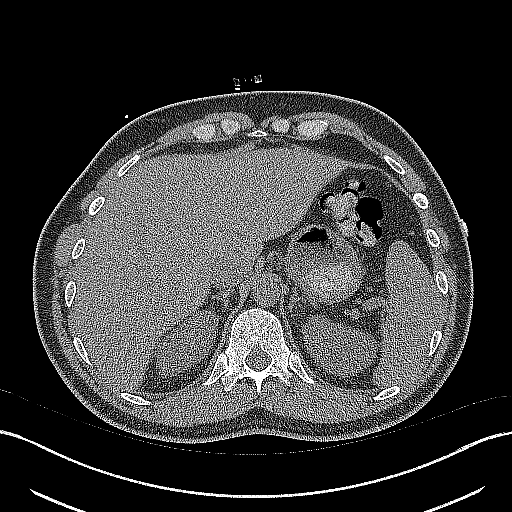
[im 20/69  soft-tissue]
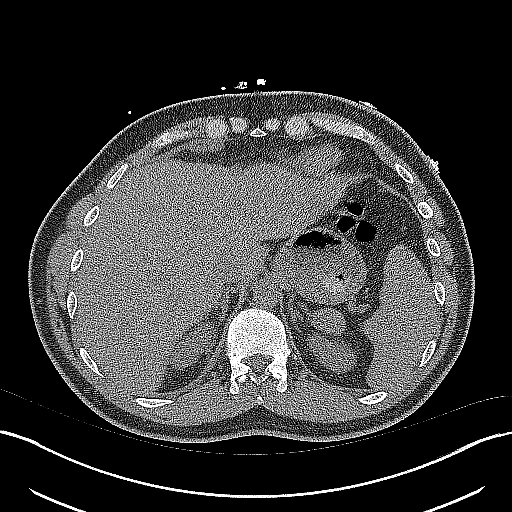
[im 25/69  soft-tissue]
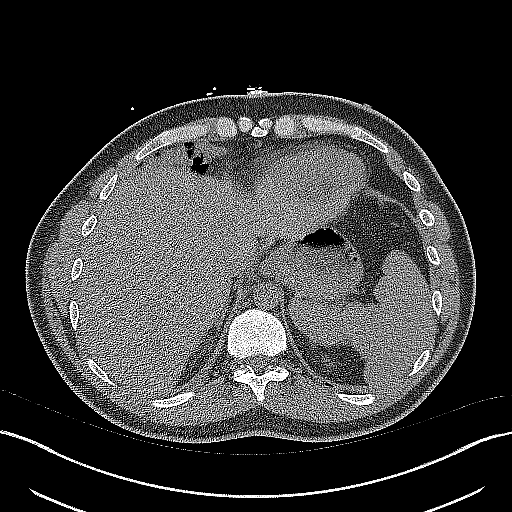
[im 29/69  soft-tissue]
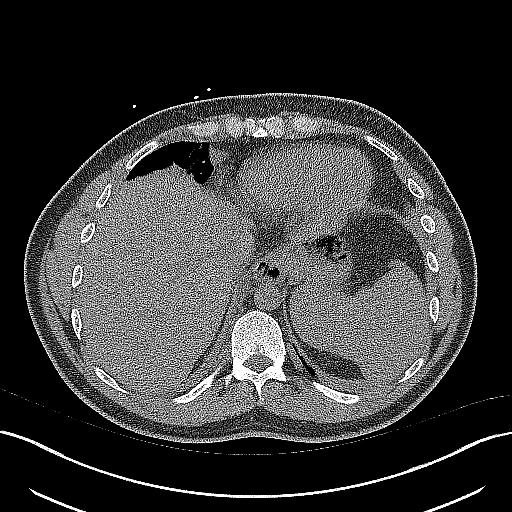
[im 36/69  soft-tissue]
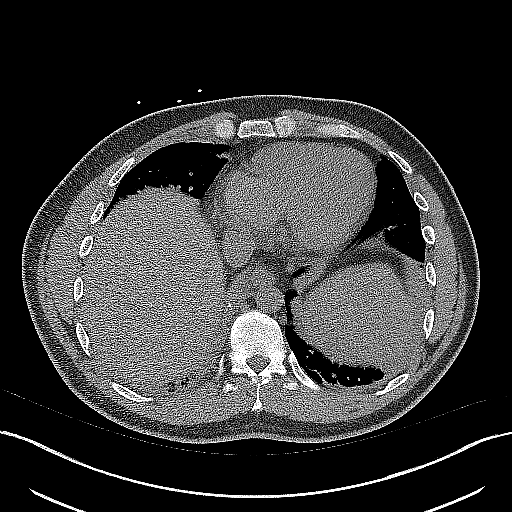
[im 40/69  soft-tissue]
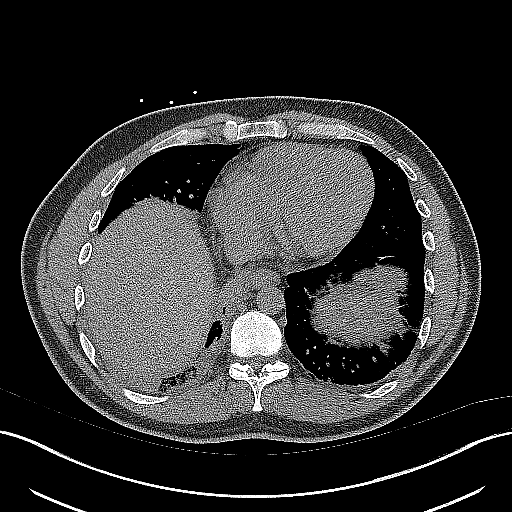
[im 44/69  soft-tissue]
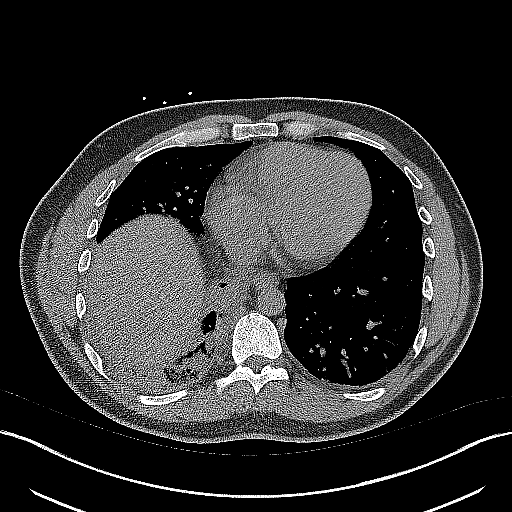
[im 44/69  bone]
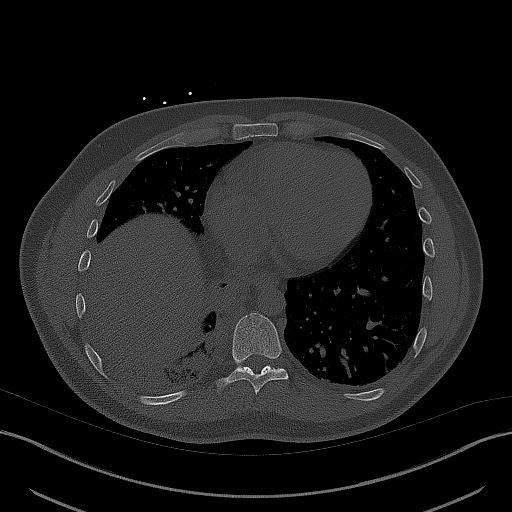
[im 49/69  soft-tissue]
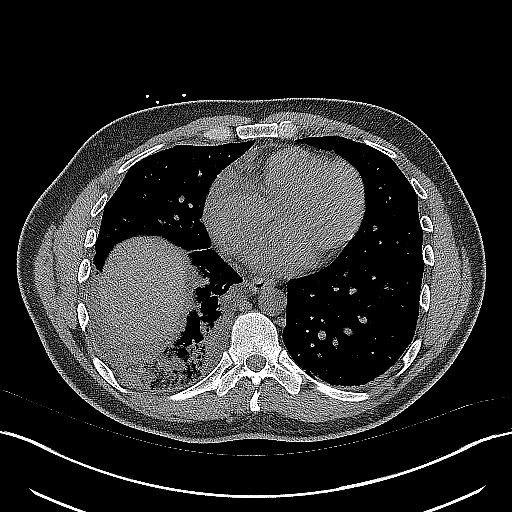
[im 55/69  soft-tissue]
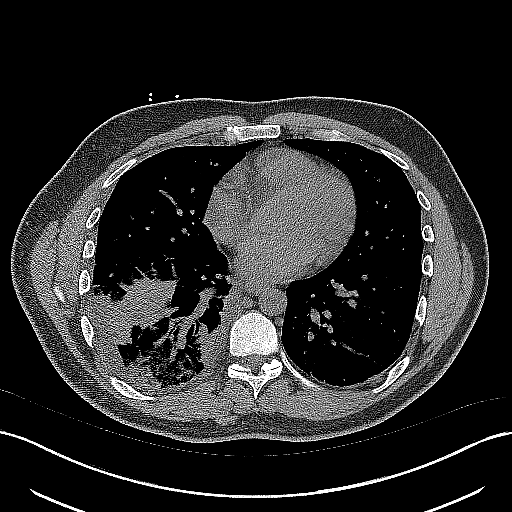
[im 60/69  soft-tissue]
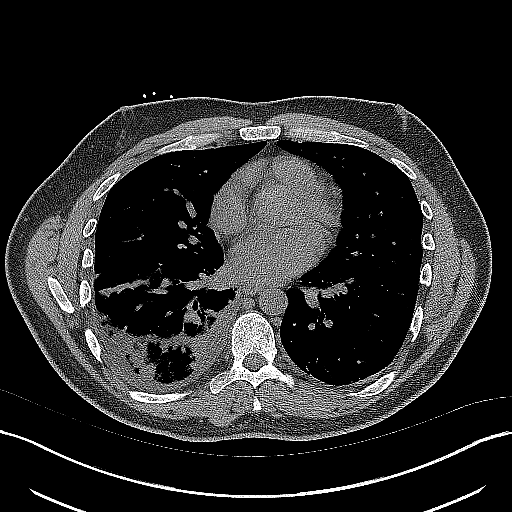
[im 64/69  soft-tissue]
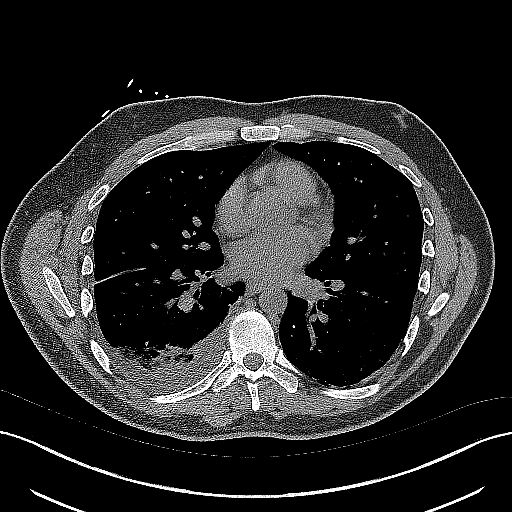

[Series 5: coronal · coronal · 0.83mm/px · 3 of 143 slices shown]
[im 48/143  soft-tissue]
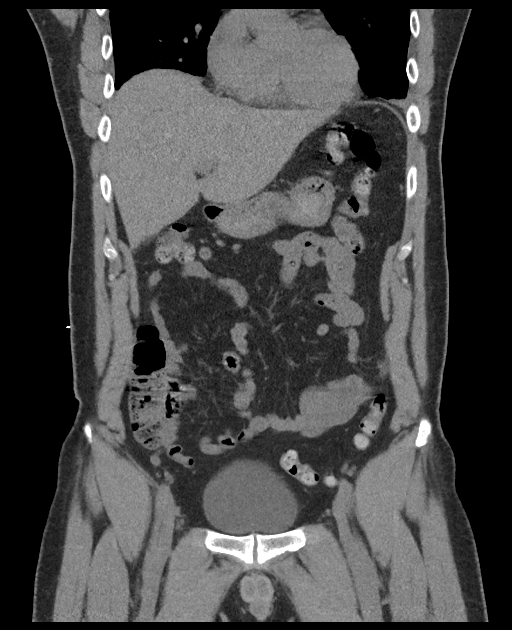
[im 64/143  soft-tissue]
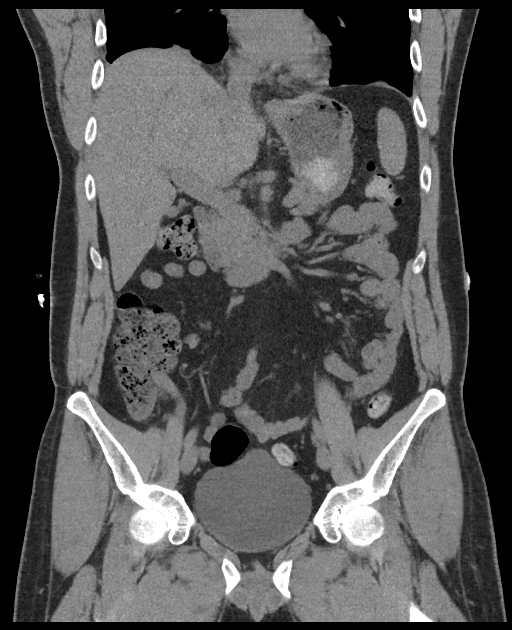
[im 79/143  soft-tissue]
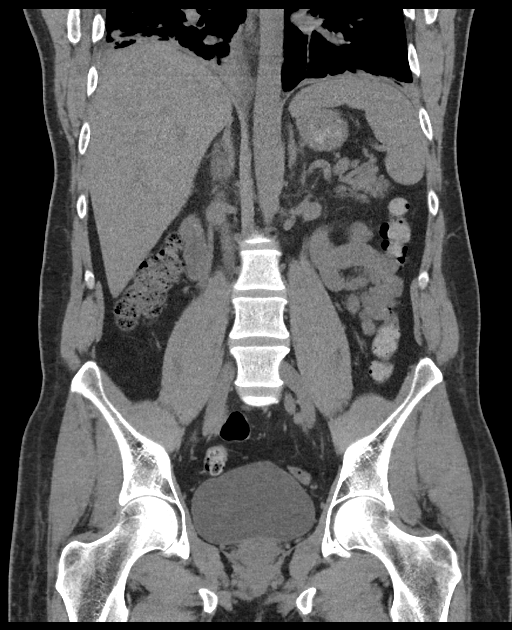

[16 of 46 positions shown; findings below may reference images not displayed]

FINDINGS: Lower chest: RIGHT lower lobe infiltrate consistent with pneumonia.
Minimal bibasilar atelectasis. Small BILATERAL pleural effusions
larger on RIGHT.

Hepatobiliary: Contracted gallbladder.  Liver normal appearance.

Pancreas: Normal appearance

Spleen: Normal appearance

Adrenals/Urinary Tract: Adrenal glands normal appearance. Kidneys
normal appearance without mass or hydronephrosis. No urinary tract
calcification or ureteral dilatation. Bladder unremarkable.

Stomach/Bowel: Normal appendix. Stomach and bowel loops normal
appearance.

Vascular/Lymphatic: Aorta normal caliber.  No adenopathy.

Reproductive: Unremarkable prostate gland and seminal vesicles

Other: No free air or free fluid. No inflammatory process. Small
umbilical hernia containing fat.

Musculoskeletal: Small bone island [id number] femoral head. No acute
osseous abnormalities.
IMPRESSION: RIGHT lower lobe infiltrate consistent with pneumonia.

Small BILATERAL pleural effusions larger on RIGHT and minimal
bibasilar atelectasis.

Small umbilical hernia containing fat.

No acute intra-abdominal or intrapelvic abnormalities.

No urinary tract calcification or dilatation.
